# Patient Record
Sex: Female | Born: 2000 | Race: White | Hispanic: No | Marital: Single | Smoking: Never smoker
Health system: Southern US, Community
[De-identification: ages and names within clinical notes are randomized; demographics above are authoritative.]

## PROBLEM LIST (undated history)

## (undated) DIAGNOSIS — F411 Generalized anxiety disorder: Secondary | ICD-10-CM

## (undated) DIAGNOSIS — J302 Other seasonal allergic rhinitis: Secondary | ICD-10-CM

## (undated) DIAGNOSIS — R51 Headache: Secondary | ICD-10-CM

## (undated) DIAGNOSIS — F32A Depression, unspecified: Secondary | ICD-10-CM

## (undated) DIAGNOSIS — T7840XA Allergy, unspecified, initial encounter: Secondary | ICD-10-CM

## (undated) DIAGNOSIS — R519 Headache, unspecified: Secondary | ICD-10-CM

## (undated) DIAGNOSIS — R630 Anorexia: Secondary | ICD-10-CM

## (undated) DIAGNOSIS — F332 Major depressive disorder, recurrent severe without psychotic features: Secondary | ICD-10-CM

## (undated) DIAGNOSIS — F909 Attention-deficit hyperactivity disorder, unspecified type: Secondary | ICD-10-CM

## (undated) DIAGNOSIS — F509 Eating disorder, unspecified: Secondary | ICD-10-CM

## (undated) DIAGNOSIS — E282 Polycystic ovarian syndrome: Secondary | ICD-10-CM

## (undated) DIAGNOSIS — R633 Feeding difficulties: Secondary | ICD-10-CM

## (undated) DIAGNOSIS — F329 Major depressive disorder, single episode, unspecified: Secondary | ICD-10-CM

## (undated) DIAGNOSIS — F419 Anxiety disorder, unspecified: Secondary | ICD-10-CM

## (undated) DIAGNOSIS — R45851 Suicidal ideations: Secondary | ICD-10-CM

## (undated) DIAGNOSIS — R440 Auditory hallucinations: Secondary | ICD-10-CM

## (undated) DIAGNOSIS — Z62820 Parent-biological child conflict: Secondary | ICD-10-CM

## (undated) HISTORY — DX: Suicidal ideations: R45.851

## (undated) HISTORY — DX: Parent-biological child conflict: Z62.820

## (undated) HISTORY — DX: Feeding difficulties: R63.3

## (undated) HISTORY — DX: Anorexia: R63.0

## (undated) HISTORY — DX: Auditory hallucinations: R44.0

## (undated) HISTORY — PX: OTHER SURGICAL HISTORY: SHX169

## (undated) HISTORY — DX: Generalized anxiety disorder: F41.1

## (undated) HISTORY — DX: Depression, unspecified: F32.A

## (undated) HISTORY — DX: Major depressive disorder, recurrent severe without psychotic features: F33.2

## (undated) HISTORY — PX: TYMPANOSTOMY TUBE PLACEMENT: SHX32

## (undated) HISTORY — DX: Major depressive disorder, single episode, unspecified: F32.9

---
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
## 2021-02-17 ENCOUNTER — Ambulatory Visit: Payer: BLUE CROSS/BLUE SHIELD | Admitting: Gastroenterology

## 2021-02-17 ENCOUNTER — Other Ambulatory Visit: Payer: Self-pay

## 2021-02-17 ENCOUNTER — Encounter: Payer: Self-pay | Admitting: Gastroenterology

## 2021-02-17 VITALS — BP 130/85 | HR 105 | Temp 98.4°F | Ht 65.0 in | Wt 196.0 lb

## 2021-02-17 DIAGNOSIS — K921 Melena: Secondary | ICD-10-CM

## 2021-02-17 DIAGNOSIS — R197 Diarrhea, unspecified: Secondary | ICD-10-CM

## 2021-02-17 DIAGNOSIS — R7982 Elevated C-reactive protein (CRP): Secondary | ICD-10-CM | POA: Diagnosis not present

## 2021-02-17 MED ORDER — NA SULFATE-K SULFATE-MG SULF 17.5-3.13-1.6 GM/177ML PO SOLN: ORAL | 0 refills | Status: DC

## 2021-02-17 NOTE — Progress Notes (Signed)
[name]  [place], [place] [address]  Main: [phone]  Fax: [phone]   Gastroenterology Consultation  Referring Provider:     Luna Fuse, MD Primary Care Physician:  Luna Fuse, MD Reason for Consultation:     Bloody diarrhea        HPI:    Chief Complaint  Patient presents with  . Bloody diarrhea    X 3 weeks  . nausea and vomiting    Dominique [name] is a 20 y.o. y/o female referred for consultation & management  by Dr. Randel Books, Tivis Ringer, MD.  Patient presents for evaluation with her father.  She states she has had 3 weeks of diarrhea with blood in stool with no prior history of similar symptoms.  No sick contacts.  No one in the family with similar symptoms.  No fever or chills.  Symptoms have not improved since being started.  Does report dull abdominal pain associated with this, intermittent, 5/10, nonradiating.  Patient had lab tests done due to this, on 02/10/2021, which showed stool test negative for Salmonella, Shigella, Campylobacter, E. coli Shiga toxin, ova, cysts or parasites, Giardia, C. difficile toxins a and B.  Fecal calprotectin was normal at 21  TIBC normal at 302, iron normal at 40, iron saturation mildly low at 13, ferritin.  Hemoglobin normal at 12.8, with normal MCV.  Normal white count. Normal liver enzymes.  TTG IgA negative.  DM.  Creatinine IgA negative.  Total IgA normal.  ESR normal at 24, with reference range being from 0-32.  CRP elevated at 13  After further questioning, patient states that for the last year she has experienced stomach cramping on a frequent basis but attributes this to her anxiety issues.  Past Medical History:  Diagnosis Date  . ADHD (attention deficit hyperactivity disorder)   . Allergy   . Anxiety   . Auditory hallucination 09/16/2016  . Decreased appetite 10/14/2015  . Depression   . Eating disorder   . GAD (generalized anxiety disorder) 12/16/2015  .  Headache   . MDD (major depressive disorder), recurrent episode, severe ([place]) 09/13/2016  . Parent-child conflict 04/23/931  . Picky eater 10/14/2015  . Seasonal allergies 10/12/2015  . Severe episode of recurrent major depressive disorder, without psychotic features ([place]) 10/09/2015  . Suicidal ideation 09/14/2016    Past Surgical History:  Procedure Laterality Date  . OTHER SURGICAL HISTORY Bilateral ukn   hx tubes in both ears/fell out  . TYMPANOSTOMY TUBE PLACEMENT Bilateral     Prior to Admission medications   Medication Sig Start Date End Date Taking? Authorizing Provider  Na Sulfate-K Sulfate-Mg Sulf 17.5-3.13-1.6 GM/177ML SOLN At 5 PM the day before procedure take 1 bottle and 5 hours before procedure take 1 bottle. 02/17/21  Yes Virgel Manifold, MD    Family History  Problem Relation Age of Onset  . Bipolar disorder Mother        dx at 77  . ADD / ADHD Father        undx  . ADD / ADHD Brother   . Depression Maternal Grandmother   . Anxiety disorder Maternal Grandmother   . Bipolar disorder Maternal Grandmother      Social History   Tobacco Use  . Smoking status: Never Smoker  . Smokeless tobacco: Never Used  Vaping Use  . Vaping Use: Never used  Substance Use Topics  . Alcohol use: No  . Drug use: No  Allergies as of 02/17/2021 - Review Complete 02/17/2021  Allergen Reaction Noted  . Elemental sulfur Hives 09/17/2018  . Omnicef [cefdinir] Hives 10/08/2015    Review of Systems:    All systems reviewed and negative except where noted in HPI.   Physical Exam:  BP 130/85   Pulse (!) 105   Temp 98.4 F (36.9 C) (Oral)   Ht $R'5\' 5"'UA$  (1.651 m)   Wt 196 lb (88.9 kg)   BMI 32.62 kg/m  No LMP recorded. Psych:  Alert and cooperative. Normal mood and affect. General:   Alert,  Well-developed, well-nourished, pleasant and cooperative in NAD Head:  Normocephalic and atraumatic. Eyes:  Sclera clear, no icterus.   Conjunctiva pink. Ears:  Normal  auditory acuity. Nose:  No deformity, discharge, or lesions. Mouth:  No deformity or lesions,oropharynx pink & moist. Neck:  Supple; no masses or thyromegaly. Abdomen:  Normal bowel sounds.  No bruits.  Soft, non-tender and non-distended without masses, hepatosplenomegaly or hernias noted.  No guarding or rebound tenderness.    Msk:  Symmetrical without gross deformities. Good, equal movement & strength bilaterally. Pulses:  Normal pulses noted. Extremities:  No clubbing or edema.  No cyanosis. Neurologic:  Alert and oriented x3;  grossly normal neurologically. Skin:  Intact without significant lesions or rashes. No jaundice. Lymph Nodes:  No significant cervical adenopathy. Psych:  Alert and cooperative. Normal mood and affect.   Labs: CBC    Component Value Date/Time   WBC 13.1 09/17/2018 2337   RBC 5.10 09/17/2018 2337   HGB 13.9 09/17/2018 2337   HCT 42.8 09/17/2018 2337   PLT 353 09/17/2018 2337   MCV 83.9 09/17/2018 2337   MCH 27.3 09/17/2018 2337   MCHC 32.5 09/17/2018 2337   RDW 12.7 09/17/2018 2337   LYMPHSABS 4.4 09/17/2018 2337   MONOABS 0.8 09/17/2018 2337   EOSABS 0.0 09/17/2018 2337   BASOSABS 0.1 09/17/2018 2337   CMP     Component Value Date/Time   NA 141 09/17/2018 2337   K 3.7 09/17/2018 2337   CL 108 09/17/2018 2337   CO2 26 09/17/2018 2337   GLUCOSE 93 09/17/2018 2337   BUN 12 09/17/2018 2337   CREATININE 0.68 09/17/2018 2337   CALCIUM 9.2 09/17/2018 2337   PROT 7.1 09/17/2018 2337   ALBUMIN 4.2 09/17/2018 2337   AST 17 09/17/2018 2337   ALT 20 09/17/2018 2337   ALKPHOS 77 09/17/2018 2337   BILITOT 0.3 09/17/2018 2337   GFRNONAA NOT CALCULATED 09/17/2018 2337   GFRAA NOT CALCULATED 09/17/2018 2337    Imaging Studies: No results found.  Assessment and Plan:   Dominique [name] is a 20 y.o. y/o female has been referred for bloody diarrhea for 3 weeks with no improvement and stool test negative for infectious etiology  Her bloody diarrhea  did start acutely with no prior history of similar symptoms, but she does state that she has chronically had abdominal cramping with some loose stools but never blood in stool before.  Since infectious panel is negative, IBD is on the differential, especially since symptoms are not improving  We discussed the option of proceeding with colonoscopy to rule out IBD, versus conservative management for another 1 to 2 weeks to see if symptoms not resolved, with a repeat stool test this week, to also include a more comprehensive stool test  However, given that majority of the infectious etiology has been ruled out with the stool test that was done, and symptoms are not improving,  patient and family would like to proceed with colonoscopy after discussing risks and benefits in detail  I have advised him to call me if symptoms improve between today and the scheduled procedure date next week  CRP is also mildly elevated, ESR high normal, and iron saturation low, therefore important to rule out IBD at this time as well  No indication for urgent procedure at this time  Dr [name]  Speech recognition software was used to dictate the above note.

## 2021-02-20 ENCOUNTER — Other Ambulatory Visit: Payer: Self-pay

## 2021-02-20 ENCOUNTER — Other Ambulatory Visit
Admission: RE | Admit: 2021-02-20 | Discharge: 2021-02-20 | Disposition: A | Discharge status: Home / Self Care | Payer: BC Managed Care – PPO | Source: Ambulatory Visit | Attending: Gastroenterology | Admitting: Gastroenterology

## 2021-02-20 DIAGNOSIS — Z01812 Encounter for preprocedural laboratory examination: Secondary | ICD-10-CM | POA: Insufficient documentation

## 2021-02-20 DIAGNOSIS — Z20822 Contact with and (suspected) exposure to covid-19: Secondary | ICD-10-CM | POA: Insufficient documentation

## 2021-02-20 LAB — SARS CORONAVIRUS 2 (TAT 6-24 HRS): SARS Coronavirus 2: NEGATIVE

## 2021-02-24 ENCOUNTER — Ambulatory Visit
Admission: RE | Admit: 2021-02-24 | Discharge: 2021-02-24 | Disposition: A | Discharge status: Home / Self Care | Payer: BC Managed Care – PPO | Attending: Gastroenterology | Admitting: Gastroenterology

## 2021-02-24 ENCOUNTER — Ambulatory Visit: Payer: BC Managed Care – PPO | Admitting: Anesthesiology

## 2021-02-24 ENCOUNTER — Encounter: Payer: Self-pay | Admitting: Gastroenterology

## 2021-02-24 ENCOUNTER — Encounter
Admission: RE | Disposition: A | Discharge status: Home / Self Care | Payer: Self-pay | Source: Home / Self Care | Attending: Gastroenterology

## 2021-02-24 DIAGNOSIS — K5100 Ulcerative (chronic) pancolitis without complications: Secondary | ICD-10-CM

## 2021-02-24 DIAGNOSIS — K921 Melena: Secondary | ICD-10-CM

## 2021-02-24 DIAGNOSIS — K51011 Ulcerative (chronic) pancolitis with rectal bleeding: Secondary | ICD-10-CM | POA: Insufficient documentation

## 2021-02-24 DIAGNOSIS — Z888 Allergy status to other drugs, medicaments and biological substances status: Secondary | ICD-10-CM | POA: Insufficient documentation

## 2021-02-24 DIAGNOSIS — K625 Hemorrhage of anus and rectum: Secondary | ICD-10-CM | POA: Diagnosis present

## 2021-02-24 DIAGNOSIS — Z818 Family history of other mental and behavioral disorders: Secondary | ICD-10-CM | POA: Insufficient documentation

## 2021-02-24 HISTORY — PX: COLONOSCOPY WITH PROPOFOL: SHX5780

## 2021-02-24 LAB — POCT PREGNANCY, URINE: Preg Test, Ur: NEGATIVE

## 2021-02-24 SURGERY — COLONOSCOPY WITH PROPOFOL
Anesthesia: General

## 2021-02-24 MED ORDER — ONDANSETRON HCL 4 MG/2ML IJ SOLN
INTRAMUSCULAR | Status: AC
  Filled 2021-02-24: qty 2

## 2021-02-24 MED ORDER — LIDOCAINE HCL (CARDIAC) PF 100 MG/5ML IV SOSY
PREFILLED_SYRINGE | INTRAVENOUS | Status: DC | PRN
  Administered 2021-02-24: 100 mg via INTRAVENOUS

## 2021-02-24 MED ORDER — PROPOFOL 500 MG/50ML IV EMUL
INTRAVENOUS | Status: DC | PRN
  Administered 2021-02-24: 180 ug/kg/min via INTRAVENOUS

## 2021-02-24 MED ORDER — PROPOFOL 10 MG/ML IV BOLUS
INTRAVENOUS | Status: DC | PRN
  Administered 2021-02-24 (×2): 100 mg via INTRAVENOUS

## 2021-02-24 MED ORDER — SODIUM CHLORIDE 0.9 % IV SOLN: INTRAVENOUS | Status: DC

## 2021-02-24 MED ORDER — PROPOFOL 500 MG/50ML IV EMUL
INTRAVENOUS | Status: AC
  Filled 2021-02-24: qty 50

## 2021-02-24 MED ORDER — ONDANSETRON HCL 4 MG/2ML IJ SOLN
INTRAMUSCULAR | Status: DC | PRN
  Administered 2021-02-24: 4 mg via INTRAVENOUS
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
## 2021-02-24 NOTE — Op Note (Signed)
[facility] Gastroenterology Patient Name: Dominique [name] Procedure Date: 02/24/2021 10:53 AM MRN: [id number] Account #: [id number] Date of Birth: [birth year] Admit Type: Outpatient Age: 20 Room: [facility] ENDO ROOM 2 Gender: Female Note Status: Finalized Procedure:             Colonoscopy Indications:           Rectal bleeding Providers:             Goebel Hellums B. Bonna Gains MD, MD Referring MD:          Tivis Ringer. Randel Books, MD (Referring MD) Medicines:             Monitored Anesthesia Care Complications:         No immediate complications. Procedure:             Pre-Anesthesia Assessment:                        - Prior to the procedure, a History and Physical was                         performed, and patient medications, allergies and                         sensitivities were reviewed. The patient's tolerance                         of previous anesthesia was reviewed.                        - The risks and benefits of the procedure and the                         sedation options and risks were discussed with the                         patient. All questions were answered and informed                         consent was obtained.                        - Patient identification and proposed procedure were                         verified prior to the procedure by the physician, the                         nurse, the anesthesiologist, the anesthetist and the                         technician. The procedure was verified in the                         pre-procedure area in the procedure room in the                         endoscopy suite.                        - Prophylactic Antibiotics: The patient does  not                         require prophylactic antibiotics.                        - ASA Grade Assessment: II - A patient with mild                         systemic disease.                        - After reviewing the risks and benefits, the patient                          was deemed in satisfactory condition to undergo the                         procedure.                        - Monitored anesthesia care was determined to be                         medically necessary for this procedure based on review                         of the patient's medical history, medications, and                         prior anesthesia history.                        - The anesthesia plan was to use monitored anesthesia                         care (MAC).                        After obtaining informed consent, the colonoscope was                         passed under direct vision. Throughout the procedure,                         the patient's blood pressure, pulse, and oxygen                         saturations were monitored continuously. The                         Colonoscope was introduced through the anus and                         advanced to the the cecum, identified by appendiceal                         orifice and ileocecal valve. The colonoscopy was                         performed with ease. The patient  tolerated the                         procedure well. The quality of the bowel preparation                         was adequate. Findings:      The perianal and digital rectal examinations were normal.      A patchy area of mucosa in the terminal ileum was mildly erythematous.       Biopsies were taken with a cold forceps for histology. The terminal       ileum had 1-2 small areas of erythema, but was otherwise normal.       Biopsies were taken of the erythematous and normal areas in separate       jars.      Diffuse severe inflammation characterized by altered vascularity,       congestion (edema), erythema, friability and shallow ulcerations was       found in the entire colon. Biopsies were taken with a cold forceps for       histology. Impression:            - Erythematous mucosa in the terminal ileum. Biopsied.                        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 Biopsied. Recommendation:        - Await pathology results.                        - Endoscopic findings suggest Ulcerative Colitis.                         Pathology was sent STAT, and pt will need to be                         initiated on medications once results confirm UC.                        - Continue present medications.                        - Return to my office in 2 weeks.                        - Return to primary care physician as previously                         scheduled.                        - The findings and recommendations were discussed with                         the patient.                        -  The findings and recommendations were discussed with                         the patient's family. 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 All rights reserved. The codes documented in this report are preliminary and upon coder review may  be revised to meet current compliance requirements.  [name], MD Margretta Sidle B. Bonna Gains MD, MD 02/24/2021 11:21:49 AM This report has been signed electronically. Number of Addenda: 0 Note Initiated On: 02/24/2021 10:53 AM Scope Withdrawal Time: 0 hours 9 minutes 7 seconds  Total Procedure Duration: 0 hours 12 minutes 39 seconds  Estimated Blood Loss:  Estimated blood loss: none.      [facility]

## 2021-02-24 NOTE — Anesthesia Preprocedure Evaluation (Signed)
Anesthesia Evaluation  Patient identified by MRN, date of birth, ID band Patient awake    Reviewed: Allergy & Precautions, NPO status , Patient's Chart, lab work & pertinent test results  History of Anesthesia Complications Negative for: history of anesthetic complications  Airway Mallampati: II  TM Distance: >3 FB Neck ROM: Full    Dental no notable dental hx.    Pulmonary neg pulmonary ROS, neg sleep apnea, neg COPD,    breath sounds clear to auscultation- rhonchi (-) wheezing      Cardiovascular Exercise Tolerance: Good (-) hypertension(-) CAD and (-) Past MI  Rhythm:Regular Rate:Normal - Systolic murmurs and - Diastolic murmurs    Neuro/Psych  Headaches, PSYCHIATRIC DISORDERS Anxiety Depression    GI/Hepatic negative GI ROS, Neg liver ROS,   Endo/Other  negative endocrine ROSneg diabetes  Renal/GU negative Renal ROS     Musculoskeletal negative musculoskeletal ROS (+)   Abdominal (+) + obese,   Peds  Hematology negative hematology ROS (+)   Anesthesia Other Findings Past Medical History: No date: ADHD (attention deficit hyperactivity disorder) No date: Allergy No date: Anxiety 09/16/2016: Auditory hallucination 10/14/2015: Decreased appetite No date: Depression No date: Eating disorder 12/16/2015: GAD (generalized anxiety disorder) No date: Headache 09/13/2016: MDD (major depressive disorder), recurrent episode,  severe ([place]) 01/07/2016: Parent-child conflict 02/63/7858: Picky eater 10/12/2015: Seasonal allergies 10/09/2015: Severe episode of recurrent major depressive disorder,  without psychotic features ([place]) 09/14/2016: Suicidal ideation   Reproductive/Obstetrics                             Anesthesia Physical Anesthesia Plan  ASA: II  Anesthesia Plan: General   Post-op Pain Management:    Induction: Intravenous  PONV Risk Score and Plan: 2 and Propofol  infusion  Airway Management Planned: Natural Airway  Additional Equipment:   Intra-op Plan:   Post-operative Plan:   Informed Consent: I have reviewed the patients History and Physical, chart, labs and discussed the procedure including the risks, benefits and alternatives for the proposed anesthesia with the patient or authorized representative who has indicated his/her understanding and acceptance.     Dental advisory given  Plan Discussed with: CRNA and Anesthesiologist  Anesthesia Plan Comments:         Anesthesia Quick Evaluation

## 2021-02-24 NOTE — Transfer of Care (Signed)
Immediate Anesthesia Transfer of Care Note  Patient: Dominique [name]  Procedure(s) Performed: COLONOSCOPY WITH PROPOFOL (N/A )  Patient Location: PACU and Endoscopy Unit  Anesthesia Type:General  Level of Consciousness: drowsy and patient cooperative  Airway & Oxygen Therapy: Patient Spontanous Breathing  Post-op Assessment: Report given to RN and Post -op Vital signs reviewed and stable  Post vital signs: Reviewed and stable  Last Vitals:  Vitals Value Taken Time  BP 97/53 02/24/21 1118  Temp    Pulse 95 02/24/21 1119  Resp 30 02/24/21 1119  SpO2 97 % 02/24/21 1119  Vitals shown include unvalidated device data.  Last Pain:  Vitals:   02/24/21 0927  TempSrc: Temporal  PainSc: 0-No pain         Complications: No complications documented.

## 2021-02-24 NOTE — Anesthesia Postprocedure Evaluation (Signed)
Anesthesia Post Note  Patient: Dominique [name]  Procedure(s) Performed: COLONOSCOPY WITH PROPOFOL (N/A )  Patient location during evaluation: Endoscopy Anesthesia Type: General Level of consciousness: awake and alert and oriented Pain management: pain level controlled Vital Signs Assessment: post-procedure vital signs reviewed and stable Respiratory status: spontaneous breathing, nonlabored ventilation and respiratory function stable Cardiovascular status: blood pressure returned to baseline and stable Postop Assessment: no signs of nausea or vomiting Anesthetic complications: no   No complications documented.   Last Vitals:  Vitals:   02/24/21 1148 02/24/21 1200  BP: 111/69 117/71  Pulse: 81 77  Resp: (!) 24 17  Temp:    SpO2: 100% 100%    Last Pain:  Vitals:   02/24/21 1200  TempSrc:   PainSc: 0-No pain                 Chaz Mcglasson

## 2021-02-25 LAB — SURGICAL PATHOLOGY

## 2021-02-26 ENCOUNTER — Other Ambulatory Visit: Payer: Self-pay | Admitting: Gastroenterology

## 2021-02-26 ENCOUNTER — Telehealth: Payer: Self-pay | Admitting: Gastroenterology

## 2021-02-26 DIAGNOSIS — K5100 Ulcerative (chronic) pancolitis without complications: Secondary | ICD-10-CM

## 2021-02-26 MED ORDER — PREDNISONE 10 MG PO TABS: 40.0000 mg | ORAL_TABLET | Freq: Every day | ORAL | 0 refills | Status: DC

## 2021-02-26 NOTE — Telephone Encounter (Signed)
Dad is calling.... dyhydrated, dizzy, nausea and vomiting. What can she do? Dad is frustrated. Please call.

## 2021-03-03 NOTE — Telephone Encounter (Signed)
Patient was called again and she did not answer. However, patient has an appointment tomorrow with Dr. Bonna Gains.  Patient was also informed that Dr. Bonna Gains is recommending labs to be drawn STAT.

## 2021-03-04 ENCOUNTER — Other Ambulatory Visit: Payer: Self-pay

## 2021-03-04 ENCOUNTER — Ambulatory Visit: Payer: BC Managed Care – PPO | Admitting: Gastroenterology

## 2021-03-04 ENCOUNTER — Encounter: Payer: Self-pay | Admitting: Gastroenterology

## 2021-03-04 VITALS — BP 121/76 | HR 80 | Temp 98.2°F | Ht 65.0 in | Wt 197.6 lb

## 2021-03-04 DIAGNOSIS — K5100 Ulcerative (chronic) pancolitis without complications: Secondary | ICD-10-CM | POA: Diagnosis not present

## 2021-03-04 NOTE — Patient Instructions (Signed)
Please finish taking your Prednisone medication.  Please call [facility] and schedule a new patient appointment at [phone].  We will send your paperwork to [facility] and they will contact you to set up injections.

## 2021-03-05 NOTE — Progress Notes (Signed)
[name], MD [address]  [place]  [place], [place] [address]  Main: [phone]  Fax: [phone]   Primary Care Physician: Luna Fuse, MD   Chief Complaint  Patient presents with  . Blood In Stools    HPI: Dominique [name] is a 20 y.o. female here for follow-up of ulcerative pancolitis.  Patient has been taking prednisone 40 mg a day starting 02/27/2021.  Patient has noted improvement in bowel movements and rectal bleeding since then.  She states she is noting less blood in her stool, decreased abdominal cramping, and decreased amount of blood in stool.  No weight loss.  No no fever or chills.  No joint pain.  No nausea or vomiting.  Current Outpatient Medications  Medication Sig Dispense Refill  . predniSONE (DELTASONE) 10 MG tablet Take 4 tablets (40 mg total) by mouth daily with breakfast. 100 tablet 0   No current facility-administered medications for this visit.    Allergies as of 03/04/2021 - Review Complete 03/04/2021  Allergen Reaction Noted  . Elemental sulfur Hives 09/17/2018  . Omnicef [cefdinir] Hives 10/08/2015    ROS:  General: Negative for anorexia, weight loss, fever, chills, fatigue, weakness. ENT: Negative for hoarseness, difficulty swallowing , nasal congestion. CV: Negative for chest pain, angina, palpitations, dyspnea on exertion, peripheral edema.  Respiratory: Negative for dyspnea at rest, dyspnea on exertion, cough, sputum, wheezing.  GI: See history of present illness. GU:  Negative for dysuria, hematuria, urinary incontinence, urinary frequency, nocturnal urination.  Endo: Negative for unusual weight change.    Physical Examination:   BP 121/76   Pulse 80   Temp 98.2 F (36.8 C) (Oral)   Ht 5\' 5"  (1.651 m)   Wt 197 lb 9.6 oz (89.6 kg)   BMI 32.88 kg/m   General: Well-nourished, well-developed in no acute distress.  Eyes: No icterus. Conjunctivae pink. Mouth: Oropharyngeal mucosa moist and pink , no  lesions erythema or exudate. Neck: Supple, Trachea midline Abdomen: Bowel sounds are normal, nontender, nondistended, no hepatosplenomegaly or masses, no abdominal bruits or hernia , no rebound or guarding.   Extremities: No lower extremity edema. No clubbing or deformities. Neuro: Alert and oriented x 3.  Grossly intact. Skin: Warm and dry, no jaundice.   Psych: Alert and cooperative, normal mood and affect.   Labs: CMP     Component Value Date/Time   NA 141 09/17/2018 2337   K 3.7 09/17/2018 2337   CL 108 09/17/2018 2337   CO2 26 09/17/2018 2337   GLUCOSE 93 09/17/2018 2337   BUN 12 09/17/2018 2337   CREATININE 0.68 09/17/2018 2337   CALCIUM 9.2 09/17/2018 2337   PROT 7.1 09/17/2018 2337   ALBUMIN 4.2 09/17/2018 2337   AST 17 09/17/2018 2337   ALT 20 09/17/2018 2337   ALKPHOS 77 09/17/2018 2337   BILITOT 0.3 09/17/2018 2337   GFRNONAA NOT CALCULATED 09/17/2018 2337   GFRAA NOT CALCULATED 09/17/2018 2337   Lab Results  Component Value Date   WBC 13.1 09/17/2018   HGB 13.9 09/17/2018   HCT 42.8 09/17/2018   MCV 83.9 09/17/2018   PLT 353 09/17/2018    Imaging Studies: No results found.  Assessment and Plan:   Dominique [name] is a 20 y.o. y/o female here for follow-up of ulcerative pancolitis  Patient has moderate to severe disease and therefore will need biologic therapy  After our visit last time, patient has researched treatment options and prefers to proceed with an  infusion option as she states she will likely forget to take her subq doses at home herself.  Her father is with her today and agrees with her as well  Adverse effects of Biologics and specifically Entyvio discussed given that patient's preference is not to be after discussing all options in detail  We will start approval process  Continue prednisone 40 mg a day, patient is currently only on day 6 of therapy  Follow-up in 10 to 14 days and if complete symptom improvement, start tapering  medication  Labs were ordered on last visit but patient never got them done.  Will obtain today.  I have strongly encouraged her to establish PCP as soon as possible and she will need to stay up-to-date on her immunizations, which was discussed with her in detail as well.  Pneumonia vaccine is recommended since patient will be on biologic therapy. Annual skin exam will be needed Patient advised to stay up-to-date on OB/GYN exams as well and establish care with an OB/GYN as well.  Patient and family agreeable with above plan and state they will start working on the above recommendations with their PCP as well    Dr [name]

## 2021-03-08 LAB — STATUS REPORT

## 2021-03-10 ENCOUNTER — Telehealth: Payer: Self-pay

## 2021-03-10 NOTE — Telephone Encounter (Signed)
-----  Message from Virgel Manifold, MD sent at 03/05/2021  3:15 PM EDT ----- Herb Grays please let the patient know, her labs show that she does not have immunity to hepatitis B.    Patient will need hepatitis B vaccination  Please contact the PCP office and tell them that patient has ulcerative colitis and will need to be on immunosuppressant, and request if they can update her vaccinations and that we are recommending that she obtain vaccination for:  1. Hep B 2. PCV 13 and then PPSV 23, 8 week later 3. Influenza  And if they can send [place] records of her MMR and Varicella vaccination as well that would be great.

## 2021-03-10 NOTE — Telephone Encounter (Signed)
[facility] reqested for [place] to fax physicians recommendations. Therefore, it was faxed to [phone].

## 2021-03-11 LAB — QUANTIFERON-TB GOLD PLUS
QuantiFERON Mitogen Value: 1.27 IU/mL
QuantiFERON Nil Value: 0.02 IU/mL
QuantiFERON TB1 Ag Value: 0.03 IU/mL
QuantiFERON TB2 Ag Value: 0.03 IU/mL
QuantiFERON-TB Gold Plus: NEGATIVE

## 2021-03-11 LAB — THIOPURINE METHYLTRANSFERASE (TPMT), RBC

## 2021-03-11 LAB — HEPATITIS B CORE ANTIBODY, TOTAL: Hep B Core Total Ab: NEGATIVE

## 2021-03-11 LAB — HEPATITIS B SURFACE ANTIGEN: Hepatitis B Surface Ag: NEGATIVE

## 2021-03-11 LAB — HEPATITIS B SURFACE ANTIBODY,QUALITATIVE: Hep B Surface Ab, Qual: NONREACTIVE

## 2021-03-16 ENCOUNTER — Encounter: Payer: Self-pay | Admitting: Gastroenterology

## 2021-03-16 ENCOUNTER — Other Ambulatory Visit: Payer: Self-pay

## 2021-03-16 ENCOUNTER — Ambulatory Visit: Payer: BC Managed Care – PPO | Admitting: Gastroenterology

## 2021-03-16 VITALS — BP 118/79 | HR 105 | Temp 98.7°F | Ht 65.0 in | Wt 204.0 lb

## 2021-03-16 DIAGNOSIS — K5100 Ulcerative (chronic) pancolitis without complications: Secondary | ICD-10-CM

## 2021-03-16 MED ORDER — PREDNISONE 10 MG PO TABS: ORAL_TABLET | ORAL | 0 refills | Status: AC

## 2021-03-16 NOTE — Patient Instructions (Addendum)
Please decrease your prednisone to 30mg  a day starting 03/21/2021 Decrease it further to 20 mg a day starting 03/27/2021 Decrease it to 10 mg a day starting 04/01/2021 and then stop

## 2021-03-17 NOTE — Progress Notes (Signed)
[name], MD [address]  [place]  [place], [place] [address]  Main: [phone]  Fax: [phone]   Primary Care Physician: Luna Fuse, MD   Chief Complaint  Patient presents with  . ulcerative pancolitis    HPI: Dominique [name] is a 20 y.o. adult here for follow-up of ulcerative pancolitis.  Patient has been on prednisone since 02/27/2021.  Reports significant improvement in symptoms, with decreased abdominal cramping, no further blood in stool since last visit.  Reporting 1-2 formed bowel movements a day.  Patient had elected to start [facility] and this was ordered.  Patient states she has been contacted and is being set up for an infusion either this week or next week.  No fever or chills.  No nausea or vomiting.  Reports improvement in appetite as well  Current Outpatient Medications  Medication Sig Dispense Refill  . predniSONE (DELTASONE) 10 MG tablet Take 4 tablets (40 mg total) by mouth daily with breakfast for 5 days, THEN 3 tablets (30 mg total) daily with breakfast for 7 days, THEN 2 tablets (20 mg total) daily with breakfast for 5 days, THEN 1 tablet (10 mg total) daily with breakfast for 5 days. 100 tablet 0   No current facility-administered medications for this visit.    Allergies as of 03/16/2021 - Review Complete 03/16/2021  Allergen Reaction Noted  . Elemental sulfur Hives 09/17/2018  . Omnicef [cefdinir] Hives 10/08/2015    ROS:  General: Negative for anorexia, weight loss, fever, chills, fatigue, weakness. ENT: Negative for hoarseness, difficulty swallowing , nasal congestion. CV: Negative for chest pain, angina, palpitations, dyspnea on exertion, peripheral edema.  Respiratory: Negative for dyspnea at rest, dyspnea on exertion, cough, sputum, wheezing.  GI: See history of present illness. GU:  Negative for dysuria, hematuria, urinary incontinence, urinary frequency, nocturnal urination.  Endo: Negative for unusual weight  change.    Physical Examination:   BP 118/79   Pulse (!) 105   Temp 98.7 F (37.1 C) (Oral)   Ht 5\' 5"  (1.651 m)   Wt 204 lb (92.5 kg)   BMI 33.95 kg/m   General: Well-nourished, well-developed in no acute distress.  Eyes: No icterus. Conjunctivae pink. Mouth: Oropharyngeal mucosa moist and pink , no lesions erythema or exudate. Neck: Supple, Trachea midline Abdomen: Bowel sounds are normal, nontender, nondistended, no hepatosplenomegaly or masses, no abdominal bruits or hernia , no rebound or guarding.   Extremities: No lower extremity edema. No clubbing or deformities. Neuro: Alert and oriented x 3.  Grossly intact. Skin: Warm and dry, no jaundice.   Psych: Alert and cooperative, normal mood and affect.   Labs: CMP     Component Value Date/Time   NA 141 09/17/2018 2337   K 3.7 09/17/2018 2337   CL 108 09/17/2018 2337   CO2 26 09/17/2018 2337   GLUCOSE 93 09/17/2018 2337   BUN 12 09/17/2018 2337   CREATININE 0.68 09/17/2018 2337   CALCIUM 9.2 09/17/2018 2337   PROT 7.1 09/17/2018 2337   ALBUMIN 4.2 09/17/2018 2337   AST 17 09/17/2018 2337   ALT 20 09/17/2018 2337   ALKPHOS 77 09/17/2018 2337   BILITOT 0.3 09/17/2018 2337   GFRNONAA NOT CALCULATED 09/17/2018 2337   GFRAA NOT CALCULATED 09/17/2018 2337   Lab Results  Component Value Date   WBC 13.1 09/17/2018   HGB 13.9 09/17/2018   HCT 42.8 09/17/2018   MCV 83.9 09/17/2018   PLT 353 09/17/2018    Imaging  Studies: No results found.  Assessment and Plan:   Dominique [name] is a 20 y.o. y/o adult here for follow-up of ulcerative pancolitis  Patient is clinically improving on prednisone We will start taperStarting 03/20/2021.  This will allow her to have 3 weeks of prednisone 40 mg daily, and then start tapering down to 30 mg daily for 7 days, 20 mg daily for 5 days, 10 mg daily for 5 days and then stop.  These instructions were given to her and sent to her pharmacy.  Patient and her father verbalized  understanding  Patient states infusion will be set up for Entyvio this week or next week.  I have advised her to let us know the exact date that the infusion get scheduled for her and she verbalized understanding  Follow-up in 4 weeks or earlier if any symptoms occur on prednisone taper  I have again advised her on the importance of getting her immunizations.  Recommendations for immunizations were faxed to her PCP as well.  I have advised her to call them this week to get her vaccination set up including hepatitis B, influenza, pneumonia, and others based on her age as detailed in our previous recommendations attached to her previous results    Dr [name]

## 2021-03-19 ENCOUNTER — Telehealth: Payer: Self-pay | Admitting: Gastroenterology

## 2021-03-19 NOTE — Telephone Encounter (Signed)
Keri from [place] called back to let you know that this patient's Dominique [name] is still pending in prior auth.  Should be back by tomorrow.  Kristin Bruins is to reach back out to patient and follow up with patient.  If questions call [phone] if you want info of insurance changes.

## 2021-04-02 ENCOUNTER — Telehealth: Payer: Self-pay | Admitting: Gastroenterology

## 2021-04-02 NOTE — Telephone Encounter (Signed)
Alisha at [facility] called [phone]   Reference code [id number]  Needs prior auth form for this patient's [employer].  Please call back

## 2021-04-14 ENCOUNTER — Telehealth: Payer: Self-pay | Admitting: Gastroenterology

## 2021-04-14 NOTE — Telephone Encounter (Signed)
Dominique [name] from [facility] returning phone call.  Prior Auth for [employer] prior auth was approved.    If  Questions, call [phone]

## 2021-04-14 NOTE — Telephone Encounter (Signed)
Called back and the [facility] and they were in a meeting. I left a voicemail to return my call.

## 2021-04-15 ENCOUNTER — Encounter: Payer: Self-pay | Admitting: Gastroenterology

## 2021-04-15 NOTE — Progress Notes (Signed)
There has been an email thread about getting this patient's medication approved.  I was able to get the medication approved on May 6th 2022 and I documented this in the [facility] email thread. Patient was supposed to get started on her infusion after this.   However, I just received an email today from Emmit Pomfret at [employer] and as per the email: "Just wanted to bring you up to speed on Dominique [name].  She is on her fathers insurance which termed on 5/23 so they decided to hold off on starting medicine until new auth is done with his new insurance policy since the first 2 doses are within 2 weeks of each other.  Once he calls [place] with his new insurance, we will have to start a new authorization since it's a new insurance for coverage.   I will keep you in the loop though!"   The patient or family did not notify or discuss with [place] before they opted to not go through with the infusion that was already approved by the initial insurance. Our clinic will call them to follow up.

## 2021-04-16 NOTE — Telephone Encounter (Signed)
Called [employer] and they stated that the patient's medication was approved by the medical side but not the pharmacy side. However, when I asked how I was able to get approved, they stated that the patient was changing insurance and therefore everything that had been done to be approved will not be valid and we will have to start all over. This information was provided to Dr. Bonna Gains. Patient will call us once her dad has a new insurance in order for [place] to start the process on getting Entyvio approved again.

## 2021-04-16 NOTE — Telephone Encounter (Signed)
Please see telephone note from 04/02/2021 in reference to the follow up of this call.

## 2021-05-07 ENCOUNTER — Other Ambulatory Visit: Payer: Self-pay

## 2021-05-07 ENCOUNTER — Ambulatory Visit: Payer: BC Managed Care – PPO | Admitting: Gastroenterology

## 2021-05-07 ENCOUNTER — Telehealth: Payer: Self-pay | Admitting: Gastroenterology

## 2021-05-07 VITALS — BP 126/81 | HR 79 | Temp 98.1°F | Ht 65.0 in | Wt 211.5 lb

## 2021-05-07 DIAGNOSIS — K5100 Ulcerative (chronic) pancolitis without complications: Secondary | ICD-10-CM

## 2021-05-07 NOTE — Telephone Encounter (Signed)
Patient was seen today in the office. She states she has got one call from [facility] but that it. Reach out to [place] with [facility] and they said they need prescription signed since patient has change insurance. Printed paper she emailed to me and faxed it back to them.  Will Email Sharrie Rothman on Monday to find out the status of the medication

## 2021-05-07 NOTE — Progress Notes (Signed)
[name], MD [address]  [place]  [place], [place] [address]  Main: [phone]  Fax: [phone]   Primary Care Physician: Luna Fuse, MD   Chief Complaint  Patient presents with   pancolitis     Patient has not started Entyvio she states she is having lower abdominal pain and some blood in stool     HPI: Dominique [name] is a 20 y.o. adult here for follow-up of ulcerative pancolitis.  Patient was started on prednisone on 02/27/2021 at 40 mg a day and started tapering it slowly on 03/20/2021 and has completed prednisone taper  As per previous documentation Entyvio approval process was done and was approved on Mar 27, 2021, but due to changes in insurance, patient did not schedule the infusion even though it was approved and we were not notified about it until the insurance had already changed.  Therefore, there is a delay in her infusion at this time.  As of the last week, patient states her stomach cramping is starting to come back that had previously improved on prednisone.  As of the last week, small amount of blood streaks in stool has also been noted.  No fever or chills.  No nausea or vomiting.   ROS: All ROS reviewed a negative except as per HPI   Past Medical History:  Diagnosis Date   ADHD (attention deficit hyperactivity disorder)    Allergy    Anxiety    Auditory hallucination 09/16/2016   Decreased appetite 10/14/2015   Depression    Eating disorder    GAD (generalized anxiety disorder) 12/16/2015   Headache    MDD (major depressive disorder), recurrent episode, severe ([place]) 09/13/2016   Parent-child conflict 02/18/761   Picky eater 10/14/2015   Seasonal allergies 10/12/2015   Severe episode of recurrent major depressive disorder, without psychotic features ([place]) 10/09/2015   Suicidal ideation 09/14/2016    Past Surgical History:  Procedure Laterality Date   COLONOSCOPY WITH PROPOFOL N/A 02/24/2021   Procedure: COLONOSCOPY WITH  PROPOFOL;  Surgeon: Virgel Manifold, MD;  Location: [facility] ENDOSCOPY;  Service: Endoscopy;  Laterality: N/A;   OTHER SURGICAL HISTORY Bilateral ukn   hx tubes in both ears/fell out   TYMPANOSTOMY TUBE PLACEMENT Bilateral     Prior to Admission medications   Not on File    Family History  Problem Relation Age of Onset   Bipolar disorder Mother        dx at 35   ADD / ADHD Father        undx   ADD / ADHD Brother    Depression Maternal Grandmother    Anxiety disorder Maternal Grandmother    Bipolar disorder Maternal Grandmother      Social History   Tobacco Use   Smoking status: Never   Smokeless tobacco: Never  Vaping Use   Vaping Use: Never used  Substance Use Topics   Alcohol use: No   Drug use: No    Allergies as of 05/07/2021 - Review Complete 05/07/2021  Allergen Reaction Noted   Elemental sulfur Hives 09/17/2018   Omnicef [cefdinir] Hives 10/08/2015    Physical Examination:   BP 126/81 (BP Location: Left Arm, Patient Position: Sitting, Cuff Size: Normal)   Pulse 79   Temp 98.1 F (36.7 C) (Oral)   Ht 5\' 5"  (1.651 m)   Wt 211 lb 8 oz (95.9 kg)   BMI 35.20 kg/m   Constitutional: General:   Alert,  Well-developed, well-nourished,  pleasant and cooperative in NAD BP 126/81 (BP Location: Left Arm, Patient Position: Sitting, Cuff Size: Normal)   Pulse 79   Temp 98.1 F (36.7 C) (Oral)   Ht 5\' 5"  (1.651 m)   Wt 211 lb 8 oz (95.9 kg)   BMI 35.20 kg/m   Eyes:  Sclera clear, no icterus.   Conjunctiva pink. PERRLA  Ears:  No scars, lesions or masses, Normal auditory acuity. Nose:  No deformity, discharge, or lesions. Mouth:  No deformity or lesions, oropharynx pink & moist.  Neck:  Supple; no masses or thyromegaly.  Respiratory: Normal respiratory effort, Normal percussion  Gastrointestinal:  Normal bowel sounds.  No bruits.  Soft, non-tender and non-distended without masses, hepatosplenomegaly or hernias noted.  No guarding or rebound tenderness.      Cardiac: No clubbing or edema.  No cyanosis. Normal posterior tibial pedal pulses noted.  Lymphatic:  No significant cervical or axillary adenopathy.  Psych:  Alert and cooperative. Normal mood and affect.  Musculoskeletal:  Normal gait. Head normocephalic, atraumatic. Symmetrical without gross deformities. 5/5 Upper and Lower extremity strength bilaterally.  Skin: Warm. Intact without significant lesions or rashes. No jaundice.  Neurologic:  Face symmetrical, tongue midline, Normal sensation to touch;  grossly normal neurologically.  Psych:  Alert and oriented x3, Alert and cooperative. Normal mood and affect.  Labs: CMP     Component Value Date/Time   NA 141 09/17/2018 2337   K 3.7 09/17/2018 2337   CL 108 09/17/2018 2337   CO2 26 09/17/2018 2337   GLUCOSE 93 09/17/2018 2337   BUN 12 09/17/2018 2337   CREATININE 0.68 09/17/2018 2337   CALCIUM 9.2 09/17/2018 2337   PROT 7.1 09/17/2018 2337   ALBUMIN 4.2 09/17/2018 2337   AST 17 09/17/2018 2337   ALT 20 09/17/2018 2337   ALKPHOS 77 09/17/2018 2337   BILITOT 0.3 09/17/2018 2337   GFRNONAA NOT CALCULATED 09/17/2018 2337   GFRAA NOT CALCULATED 09/17/2018 2337   Lab Results  Component Value Date   WBC 13.1 09/17/2018   HGB 13.9 09/17/2018   HCT 42.8 09/17/2018   MCV 83.9 09/17/2018   PLT 353 09/17/2018    Imaging Studies:    Assessment and Plan:   Dominique [name] is a 20 y.o. y/o adult here for follow-up of ulcerative pancolitis  Our clinic staff was able to reach the appropriate personnel in regard to her [facility] approval process and as per them, they just need an updated prescription for the medication again, and this has been faxed to them today.  They state did that as soon as they get it, they should be able to send her/schedule her medication dose.  I have advised the patient to call us back within 2 to 3 days if she does not have the medication by then.  However, if her symptoms get worse in the  meantime, she was advised to call us even after hours if needed.  Currently she does not have any fever or chills.  Her symptoms are starting to recur, although only mildly over the last week.  Therefore, I do not think that we need to repeat prednisone therapy at this point.  If her Weyman Rodney continues to be delayed due to lapses in insurance as described in HPI, we may need to consider starting Imuran in the meantime while we are waiting Entyvio approval and then start Entyvio once it is approved for combination therapy  Continue follow-up in clinic closely  We had an extensive  discussion with the patient about Imuran as well given that we may need to possibly started if Weyman Rodney is not available within the next week.  We discussed risks with Imuran including infection, lymphoma, malignancy.  Patient understands these risks and is okay with starting the medication if needed.   Dr [name]

## 2021-05-07 NOTE — Telephone Encounter (Signed)
Needs orders for [facility]

## 2021-05-14 ENCOUNTER — Telehealth: Payer: Self-pay

## 2021-05-14 NOTE — Telephone Encounter (Signed)
Per Sharrie Rothman with [facility]:  Ms. Skalla is scheduled for 6/29 for her induction infusion

## 2021-06-18 ENCOUNTER — Encounter: Payer: Self-pay | Admitting: Gastroenterology

## 2021-06-18 ENCOUNTER — Other Ambulatory Visit: Payer: Self-pay

## 2021-06-18 ENCOUNTER — Ambulatory Visit: Payer: 59 | Admitting: Gastroenterology

## 2021-06-18 VITALS — BP 126/85 | HR 78 | Temp 98.5°F | Wt 211.6 lb

## 2021-06-18 DIAGNOSIS — K5100 Ulcerative (chronic) pancolitis without complications: Secondary | ICD-10-CM

## 2021-06-18 DIAGNOSIS — R109 Unspecified abdominal pain: Secondary | ICD-10-CM

## 2021-06-18 MED ORDER — DICYCLOMINE HCL 10 MG PO CAPS: 10.0000 mg | ORAL_CAPSULE | Freq: Three times a day (TID) | ORAL | 0 refills | Status: DC | PRN

## 2021-06-18 NOTE — Progress Notes (Signed)
Dominique Antigua, MD [address]  [place]  [place], [place] [address]  Main: [phone]  Fax: [phone]   Primary Care Physician: Dominique Fuse, MD   Chief complaint: Ulcerative colitis  HPI: Dominique [name] is a 20 y.o. adult here for follow-up of ulcerative pancolitis.  Patient began Entyvio infusion on 05/20/2021.  Is now reporting 2 formed bowel movements a day, with no blood.  Does not have any abdominal pain, but does have some abdominal cramping the day after that that she receives her Entyvio infusions.  Has received 2 infusion so far.  Reports good appetite.  No fever or chills.  No extraintestinal manifestations.  Previous history: Patient was started on prednisone on 02/27/2021 at 40 mg a day and started tapering it slowly on 03/20/2021 and has completed prednisone taper  Index colonoscopy February 24, 2021 Impression:            - Erythematous mucosa in the terminal ileum. Biopsied.                        - Diffuse severe inflammation was found in the entire                        examined colon secondary to pancolitis ulcerative                        colitis. Biopsied. Recommendation:        - Await pathology results.                        - Endoscopic findings suggest Ulcerative Colitis.  DIAGNOSIS:  A. TERMINAL ILEUM; BIOPSY:  - ENTERIC MUCOSA WITH REACTIVE LYMPHOID AGGREGATE.  - NEGATIVE FOR ACTIVE MUCOSAL INFLAMMATION, GRANULOMA AND DYSPLASIA.   B. TERMINAL ILEUM ERYTHEMA; BIOPSY:  - ENTERIC MUCOSA WITH NO EVIDENCE OF ACTIVE MUCOSAL INFLAMMATION.  - NEGATIVE FOR GRANULOMA AND DYSPLASIA.   C. COLON; BIOPSIES:  - CHRONIC ACTIVE MUCOSAL COLITIS WITH ULCERATION.  - FINDINGS CONSISTENT WITH IDIOPATHIC CHRONIC INFLAMMATORY BOWEL DISEASE  (ULCERATIVE COLITIS).  - NEGATIVE FOR DYSPLASIA.   As per previous documentation Entyvio approval process was done and was approved on Mar 27, 2021, but due to changes in insurance, patient did not schedule the  infusion even though it was approved and we were not notified about it until the insurance had already changed.  Therefore, there is a delay in her infusion at this time.  Initial visit on 02/17/2021 Patient had lab tests done due to this, on 02/10/2021, which showed stool test negative for Salmonella, Shigella, Campylobacter, E. coli Shiga toxin, ova, cysts or parasites, Giardia, C. difficile toxins a and B.  Fecal calprotectin was normal at 21  TIBC normal at 302, iron normal at 40, iron saturation mildly low at 13, ferritin.  Hemoglobin normal at 12.8, with normal MCV.  Normal white count. Normal liver enzymes.  TTG IgA negative.  DM.  Creatinine IgA negative.  Total IgA normal.  ESR normal at 24, with reference range being from 0-32.  CRP elevated at 13  After further questioning, patient states that for the last year she has experienced stomach cramping on a frequent basis but attributes this to her anxiety issues.      ROS: All ROS reviewed and negative except as per HPI   Past Medical History:  Diagnosis Date   ADHD (attention deficit  hyperactivity disorder)    Allergy    Anxiety    Auditory hallucination 09/16/2016   Decreased appetite 10/14/2015   Depression    Eating disorder    GAD (generalized anxiety disorder) 12/16/2015   Headache    MDD (major depressive disorder), recurrent episode, severe ([place]) 09/13/2016   Parent-child conflict 3/38/2505   Picky eater 10/14/2015   Seasonal allergies 10/12/2015   Severe episode of recurrent major depressive disorder, without psychotic features ([place]) 10/09/2015   Suicidal ideation 09/14/2016    Past Surgical History:  Procedure Laterality Date   COLONOSCOPY WITH PROPOFOL N/A 02/24/2021   Procedure: COLONOSCOPY WITH PROPOFOL;  Surgeon: Virgel Manifold, MD;  Location: [facility] ENDOSCOPY;  Service: Endoscopy;  Laterality: N/A;   OTHER SURGICAL HISTORY Bilateral ukn   hx tubes in both ears/fell out   TYMPANOSTOMY TUBE PLACEMENT  Bilateral     Prior to Admission medications   Not on File    Family History  Problem Relation Age of Onset   Bipolar disorder Mother        dx at 60   ADD / ADHD Father        undx   ADD / ADHD Brother    Depression Maternal Grandmother    Anxiety disorder Maternal Grandmother    Bipolar disorder Maternal Grandmother      Social History   Tobacco Use   Smoking status: Never   Smokeless tobacco: Never  Vaping Use   Vaping Use: Never used  Substance Use Topics   Alcohol use: No   Drug use: No    Allergies as of 06/18/2021 - Review Complete 06/18/2021  Allergen Reaction Noted   Elemental sulfur Hives 09/17/2018   Omnicef [cefdinir] Hives 10/08/2015    Physical Examination:  Constitutional: General:   Alert,  Well-developed, well-nourished, pleasant and cooperative in NAD BP 126/85   Pulse 78   Temp 98.5 F (36.9 C) (Oral)   Wt 211 lb 9.6 oz (96 kg)   BMI 35.21 kg/m   Respiratory: Normal respiratory effort  Gastrointestinal:  Soft, non-tender and non-distended without masses, hepatosplenomegaly or hernias noted.  No guarding or rebound tenderness.     Cardiac: No clubbing or edema.  No cyanosis. Normal posterior tibial pedal pulses noted.  Psych:  Alert and cooperative. Normal mood and affect.  Musculoskeletal:  Normal gait. Head normocephalic, atraumatic. Symmetrical without gross deformities. 5/5 Lower extremity strength bilaterally.  Skin: Warm. Intact without significant lesions or rashes. No jaundice.  Neck: Supple, trachea midline  Lymph: No cervical lymphadenopathy  Psych:  Alert and oriented x3, Alert and cooperative. Normal mood and affect.  Labs: CMP     Component Value Date/Time   NA 141 09/17/2018 2337   K 3.7 09/17/2018 2337   CL 108 09/17/2018 2337   CO2 26 09/17/2018 2337   GLUCOSE 93 09/17/2018 2337   BUN 12 09/17/2018 2337   CREATININE 0.68 09/17/2018 2337   CALCIUM 9.2 09/17/2018 2337   PROT 7.1 09/17/2018 2337   ALBUMIN  4.2 09/17/2018 2337   AST 17 09/17/2018 2337   ALT 20 09/17/2018 2337   ALKPHOS 77 09/17/2018 2337   BILITOT 0.3 09/17/2018 2337   GFRNONAA NOT CALCULATED 09/17/2018 2337   GFRAA NOT CALCULATED 09/17/2018 2337   Lab Results  Component Value Date   WBC 13.1 09/17/2018   HGB 13.9 09/17/2018   HCT 42.8 09/17/2018   MCV 83.9 09/17/2018   PLT 353 09/17/2018    Imaging Studies:   Assessment and  Plan:   Dominique [name] is a 20 y.o. y/o adult with ulcerative pancolitis diagnosed on colonoscopy in April 2022, started Entyvio infusion on May 20, 2021, here for follow-up  Patient is not having any further blood streaks in stool and has 2 formed bowel movements a day.  Therefore, clinically symptoms have improved  We will check labs at this time, including inflammatory markers, CRP.  We will also check CBC and CMP.  Plan would be to repeat colonoscopy in 2 to 3 months to reevaluate for endoscopic and histologic remission  Patient advised to let us know if symptoms change before next visit and do not continue to improve as they have been since the Entyvio infusion was started  She does not have any further abdominal pain, but does report cramping the day after her Entyvio infusion that last 2 to 3 hours and then resolves.  Can use Bentyl on those dates and as needed prescription was sent to the pharmacy for this  Importance of getting her immunizations up-to-date were discussed with her again just like previous visits and patient was advised to follow-up with PCP in this regard.  Immunization record sent by PCP in May 2022 was reviewed.  See media for the scanned report.  This does show that she got another dose of hep B vaccine in January 2022.  I will recheck hep B antibody at this time as well  The vaccine record does not show that she was immunized for pneumonia.  No recent influenza vaccine either.  Again importance of obtaining these vaccines discussed with the patient and she  states she will follow-up with her PCP about this.    Dr Dominique [name]

## 2021-06-19 LAB — COMPREHENSIVE METABOLIC PANEL
ALT: 13 IU/L (ref 0–32)
AST: 13 IU/L (ref 0–40)
Albumin/Globulin Ratio: 1.7 (ref 1.2–2.2)
Albumin: 4.5 g/dL (ref 3.9–5.0)
Alkaline Phosphatase: 86 IU/L (ref 42–106)
BUN/Creatinine Ratio: 14 (ref 9–23)
BUN: 10 mg/dL (ref 6–20)
Bilirubin Total: <0.2 mg/dL (ref 0.0–1.2)
CO2: 20 mmol/L (ref 20–29)
Calcium: 9.5 mg/dL (ref 8.7–10.2)
Chloride: 104 mmol/L (ref 96–106)
Creatinine, Ser: 0.71 mg/dL (ref 0.57–1.00)
Globulin, Total: 2.6 g/dL (ref 1.5–4.5)
Glucose: 92 mg/dL (ref 65–99)
Potassium: 4.6 mmol/L (ref 3.5–5.2)
Sodium: 140 mmol/L (ref 134–144)
Total Protein: 7.1 g/dL (ref 6.0–8.5)
eGFR: 125 mL/min/{1.73_m2} (ref >59)

## 2021-06-19 LAB — CBC
Hematocrit: 39.3 % (ref 34.0–46.6)
Hemoglobin: 12.4 g/dL (ref 11.1–15.9)
MCH: 24.4 pg — ABNORMAL LOW (ref 26.6–33.0)
MCHC: 31.6 g/dL (ref 31.5–35.7)
MCV: 77 fL — ABNORMAL LOW (ref 79–97)
Platelets: 378 10*3/uL (ref 150–450)
RBC: 5.08 x10E6/uL (ref 3.77–5.28)
RDW: 15.4 % (ref 11.7–15.4)
WBC: 8.6 10*3/uL (ref 3.4–10.8)

## 2021-06-19 LAB — C-REACTIVE PROTEIN: CRP: 3 mg/L (ref 0–10)

## 2021-06-19 LAB — HEPATITIS B SURFACE ANTIBODY,QUALITATIVE: Hep B Surface Ab, Qual: NONREACTIVE

## 2021-06-19 LAB — SPECIMEN STATUS REPORT

## 2021-08-25 ENCOUNTER — Other Ambulatory Visit: Payer: Self-pay

## 2021-08-25 ENCOUNTER — Ambulatory Visit: Payer: 59 | Admitting: Gastroenterology

## 2021-08-25 VITALS — BP 124/77 | HR 74 | Temp 98.9°F | Wt 209.2 lb

## 2021-08-25 DIAGNOSIS — R197 Diarrhea, unspecified: Secondary | ICD-10-CM

## 2021-08-25 DIAGNOSIS — K5100 Ulcerative (chronic) pancolitis without complications: Secondary | ICD-10-CM

## 2021-08-26 NOTE — Progress Notes (Signed)
[name], MD [address]  [place]  [place], [place] [address]  Main: [phone]  Fax: [phone]   Primary Care Physician: Luna Fuse, MD   CC: follow up for colitis   HPI: Dominique [name] is a 20 y.o. adult here for follow-up of ulcerative pancolitis.  Patient is compliant with Entyvio infusion that was initially started on 05/20/2021.  As of the last 1 to 2 weeks is reporting diarrhea, with 2-3 loose bowel movements a day, compared to 1-2 formed bowel movements a day that she was having previously.  Has not seen any blood in stool.  No fever or chills.  Does have some abdominal cramping, but not abdominal pain.  Previous history: Patient was started on prednisone on 02/27/2021 at 40 mg a day and started tapering it slowly on 03/20/2021 and has completed prednisone taper   Index colonoscopy February 24, 2021 Impression:            - Erythematous mucosa in the terminal ileum. Biopsied.                        - Diffuse severe inflammation was found in the entire                        examined colon secondary to pancolitis ulcerative                        colitis. Biopsied. Recommendation:        - Await pathology results.                        - Endoscopic findings suggest Ulcerative Colitis.   DIAGNOSIS:  A. TERMINAL ILEUM; BIOPSY:  - ENTERIC MUCOSA WITH REACTIVE LYMPHOID AGGREGATE.  - NEGATIVE FOR ACTIVE MUCOSAL INFLAMMATION, GRANULOMA AND DYSPLASIA.   B. TERMINAL ILEUM ERYTHEMA; BIOPSY:  - ENTERIC MUCOSA WITH NO EVIDENCE OF ACTIVE MUCOSAL INFLAMMATION.  - NEGATIVE FOR GRANULOMA AND DYSPLASIA.   C. COLON; BIOPSIES:  - CHRONIC ACTIVE MUCOSAL COLITIS WITH ULCERATION.  - FINDINGS CONSISTENT WITH IDIOPATHIC CHRONIC INFLAMMATORY BOWEL DISEASE  (ULCERATIVE COLITIS).  - NEGATIVE FOR DYSPLASIA.    As per previous documentation Entyvio approval process was done and was approved on Mar 27, 2021, but due to changes in insurance, patient did not schedule  the infusion even though it was approved and we were not notified about it until the insurance had already changed.  Therefore, there is a delay in her infusion at this time.   Initial visit on 02/17/2021 Patient had lab tests done due to this, on 02/10/2021, which showed stool test negative for Salmonella, Shigella, Campylobacter, E. coli Shiga toxin, ova, cysts or parasites, Giardia, C. difficile toxins a and B.   Fecal calprotectin was normal at 21   TIBC normal at 302, iron normal at 40, iron saturation mildly low at 13, ferritin.  Hemoglobin normal at 12.8, with normal MCV.  Normal white count. Normal liver enzymes.  TTG IgA negative.  DM.  Creatinine IgA negative.  Total IgA normal.  ESR normal at 24, with reference range being from 0-32.  CRP elevated at 13   After further questioning, patient states that for the last year she has experienced stomach cramping on a frequent basis but attributes this to her anxiety issues.   ROS: All ROS reviewed and negative except as per HPI  Past Medical History:  Diagnosis Date   ADHD (attention deficit hyperactivity disorder)    Allergy    Anxiety    Auditory hallucination 09/16/2016   Decreased appetite 10/14/2015   Depression    Eating disorder    GAD (generalized anxiety disorder) 12/16/2015   Headache    MDD (major depressive disorder), recurrent episode, severe ([place]) 09/13/2016   Parent-child conflict 6/96/2952   Picky eater 10/14/2015   Seasonal allergies 10/12/2015   Severe episode of recurrent major depressive disorder, without psychotic features ([place]) 10/09/2015   Suicidal ideation 09/14/2016    Past Surgical History:  Procedure Laterality Date   COLONOSCOPY WITH PROPOFOL N/A 02/24/2021   Procedure: COLONOSCOPY WITH PROPOFOL;  Surgeon: Virgel Manifold, MD;  Location: [facility] ENDOSCOPY;  Service: Endoscopy;  Laterality: N/A;   OTHER SURGICAL HISTORY Bilateral ukn   hx tubes in both ears/fell out   TYMPANOSTOMY TUBE PLACEMENT  Bilateral     Prior to Admission medications   Medication Sig Start Date End Date Taking? Authorizing Provider  dicyclomine (BENTYL) 10 MG capsule Take 1 capsule (10 mg total) by mouth 3 (three) times daily as needed for up to 30 doses for spasms. 06/18/21   Virgel Manifold, MD    Family History  Problem Relation Age of Onset   Bipolar disorder Mother        dx at 81   ADD / ADHD Father        undx   ADD / ADHD Brother    Depression Maternal Grandmother    Anxiety disorder Maternal Grandmother    Bipolar disorder Maternal Grandmother      Social History   Tobacco Use   Smoking status: Never   Smokeless tobacco: Never  Vaping Use   Vaping Use: Never used  Substance Use Topics   Alcohol use: No   Drug use: No    Allergies as of 08/25/2021 - Review Complete 06/18/2021  Allergen Reaction Noted   Elemental sulfur Hives 09/17/2018   Omnicef [cefdinir] Hives 10/08/2015    Physical Examination:  Constitutional: General:   Alert,  Well-developed, well-nourished, pleasant and cooperative in NAD BP 124/77   Pulse 74   Temp 98.9 F (37.2 C) (Oral)   Wt 209 lb 3.2 oz (94.9 kg)   BMI 34.81 kg/m   Respiratory: Normal respiratory effort  Gastrointestinal:  Soft, non-tender and non-distended without masses, hepatosplenomegaly or hernias noted.  No guarding or rebound tenderness.     Cardiac: No clubbing or edema.  No cyanosis. Normal posterior tibial pedal pulses noted.  Psych:  Alert and cooperative. Normal mood and affect.  Musculoskeletal:  Normal gait. Head normocephalic, atraumatic. Symmetrical without gross deformities. 5/5 Lower extremity strength bilaterally.  Skin: Warm. Intact without significant lesions or rashes. No jaundice.  Neck: Supple, trachea midline  Lymph: No cervical lymphadenopathy  Psych:  Alert and oriented x3, Alert and cooperative. Normal mood and affect.  Labs: CMP     Component Value Date/Time   NA 140 06/18/2021 1353   K 4.6  06/18/2021 1353   CL 104 06/18/2021 1353   CO2 20 06/18/2021 1353   GLUCOSE 92 06/18/2021 1353   GLUCOSE 93 09/17/2018 2337   BUN 10 06/18/2021 1353   CREATININE 0.71 06/18/2021 1353   CALCIUM 9.5 06/18/2021 1353   PROT 7.1 06/18/2021 1353   ALBUMIN 4.5 06/18/2021 1353   AST 13 06/18/2021 1353   ALT 13 06/18/2021 1353   ALKPHOS 86 06/18/2021 1353   BILITOT <0.2 06/18/2021  [place] 09/17/2018 2337   GFRAA NOT CALCULATED 09/17/2018 2337   Lab Results  Component Value Date   WBC 8.6 06/18/2021   HGB 12.4 06/18/2021   HCT 39.3 06/18/2021   MCV 77 (L) 06/18/2021   PLT 378 06/18/2021    Imaging Studies:   Assessment and Plan:   Dominique [name] is a 20 y.o. y/o adult here for follow-up of ulcerative pancolitis, currently on Entyvio, but reporting to 2 loose bowel movements a day for the last 1 to 2 weeks  Will rule out infectious etiology, with GI profile, check for C. difficile, and obtain fecal calprotectin Blood work including CBC, CMP, CRP ordered as well  Continue current Entyvio infusion  If infectious work-up is positive, will treat as necessary, allow for infection to resolve and then subsequent colonoscopy to assess for endoscopic and histologic improvement on Entyvio 6 to 8 weeks later  If infectious work-up is negative, proceed with colonoscopy   Dr [name]

## 2021-08-27 ENCOUNTER — Other Ambulatory Visit: Payer: Self-pay | Admitting: Gastroenterology

## 2021-08-27 DIAGNOSIS — K5100 Ulcerative (chronic) pancolitis without complications: Secondary | ICD-10-CM

## 2021-08-28 LAB — CBC
Hematocrit: 38.1 % (ref 34.0–46.6)
Hemoglobin: 12.2 g/dL (ref 11.1–15.9)
MCH: 25.2 pg — ABNORMAL LOW (ref 26.6–33.0)
MCHC: 32 g/dL (ref 31.5–35.7)
MCV: 79 fL (ref 79–97)
Platelets: 368 10*3/uL (ref 150–450)
RBC: 4.85 x10E6/uL (ref 3.77–5.28)
RDW: 14.2 % (ref 11.7–15.4)
WBC: 10.2 10*3/uL (ref 3.4–10.8)

## 2021-08-28 LAB — COMPREHENSIVE METABOLIC PANEL
ALT: 16 IU/L (ref 0–32)
AST: 16 IU/L (ref 0–40)
Albumin/Globulin Ratio: 2 (ref 1.2–2.2)
Albumin: 4.5 g/dL (ref 3.9–5.0)
Alkaline Phosphatase: 96 IU/L (ref 42–106)
BUN/Creatinine Ratio: 16 (ref 9–23)
BUN: 12 mg/dL (ref 6–20)
Bilirubin Total: 0.4 mg/dL (ref 0.0–1.2)
CO2: 21 mmol/L (ref 20–29)
Calcium: 9.5 mg/dL (ref 8.7–10.2)
Chloride: 102 mmol/L (ref 96–106)
Creatinine, Ser: 0.73 mg/dL (ref 0.57–1.00)
Globulin, Total: 2.3 g/dL (ref 1.5–4.5)
Glucose: 114 mg/dL — ABNORMAL HIGH (ref 70–99)
Potassium: 4.1 mmol/L (ref 3.5–5.2)
Sodium: 141 mmol/L (ref 134–144)
Total Protein: 6.8 g/dL (ref 6.0–8.5)
eGFR: 121 mL/min/{1.73_m2} (ref >59)

## 2021-08-28 LAB — C-REACTIVE PROTEIN: CRP: 5 mg/L (ref 0–10)

## 2021-09-02 MED ORDER — NA SULFATE-K SULFATE-MG SULF 17.5-3.13-1.6 GM/177ML PO SOLN: 1.0000 | Freq: Once | ORAL | 0 refills | Status: AC

## 2021-09-02 NOTE — Addendum Note (Signed)
Addended by: Lurlean Nanny on: 09/02/2021 10:33 AM   Modules accepted: Orders, SmartSet

## 2021-09-05 LAB — GI PROFILE, STOOL, PCR

## 2021-09-05 LAB — CALPROTECTIN, FECAL: Calprotectin, Fecal: <16 ug/g (ref 0–120)

## 2021-09-10 ENCOUNTER — Encounter
Admission: RE | Disposition: A | Discharge status: Home / Self Care | Payer: Self-pay | Source: Home / Self Care | Attending: Gastroenterology

## 2021-09-10 ENCOUNTER — Ambulatory Visit: Payer: 59 | Admitting: Anesthesiology

## 2021-09-10 ENCOUNTER — Ambulatory Visit
Admission: RE | Admit: 2021-09-10 | Discharge: 2021-09-10 | Disposition: A | Discharge status: Home / Self Care | Payer: 59 | Attending: Gastroenterology | Admitting: Gastroenterology

## 2021-09-10 ENCOUNTER — Encounter: Payer: Self-pay | Admitting: Gastroenterology

## 2021-09-10 DIAGNOSIS — Z79899 Other long term (current) drug therapy: Secondary | ICD-10-CM | POA: Insufficient documentation

## 2021-09-10 DIAGNOSIS — K5100 Ulcerative (chronic) pancolitis without complications: Secondary | ICD-10-CM

## 2021-09-10 DIAGNOSIS — Z888 Allergy status to other drugs, medicaments and biological substances status: Secondary | ICD-10-CM | POA: Insufficient documentation

## 2021-09-10 DIAGNOSIS — Z1211 Encounter for screening for malignant neoplasm of colon: Secondary | ICD-10-CM | POA: Diagnosis present

## 2021-09-10 DIAGNOSIS — K648 Other hemorrhoids: Secondary | ICD-10-CM | POA: Insufficient documentation

## 2021-09-10 DIAGNOSIS — R6339 Other feeding difficulties: Secondary | ICD-10-CM | POA: Diagnosis not present

## 2021-09-10 DIAGNOSIS — K5289 Other specified noninfective gastroenteritis and colitis: Secondary | ICD-10-CM | POA: Insufficient documentation

## 2021-09-10 HISTORY — PX: COLONOSCOPY WITH PROPOFOL: SHX5780

## 2021-09-10 LAB — POCT PREGNANCY, URINE: Preg Test, Ur: NEGATIVE

## 2021-09-10 SURGERY — COLONOSCOPY WITH PROPOFOL
Anesthesia: General

## 2021-09-10 MED ORDER — SODIUM CHLORIDE 0.9 % IV SOLN
INTRAVENOUS | Status: DC
  Administered 2021-09-10: 1000 mL via INTRAVENOUS

## 2021-09-10 MED ORDER — PROPOFOL 10 MG/ML IV BOLUS
INTRAVENOUS | Status: DC | PRN
  Administered 2021-09-10: 30 mg via INTRAVENOUS
  Administered 2021-09-10 (×2): 20 mg via INTRAVENOUS
  Administered 2021-09-10: 80 mg via INTRAVENOUS

## 2021-09-10 MED ORDER — PROPOFOL 500 MG/50ML IV EMUL
INTRAVENOUS | Status: DC | PRN
  Administered 2021-09-10: 140 ug/kg/min via INTRAVENOUS

## 2021-09-10 NOTE — Anesthesia Preprocedure Evaluation (Signed)
Anesthesia Evaluation  Patient identified by MRN, date of birth, ID band Patient awake    Reviewed: Allergy & Precautions, NPO status , Patient's Chart, lab work & pertinent test results  History of Anesthesia Complications Negative for: history of anesthetic complications  Airway Mallampati: III  TM Distance: <3 FB Neck ROM: full    Dental  (+) Chipped   Pulmonary neg pulmonary ROS, neg shortness of breath,    Pulmonary exam normal        Cardiovascular Exercise Tolerance: Good (-) anginanegative cardio ROS Normal cardiovascular exam     Neuro/Psych  Headaches, negative psych ROS   GI/Hepatic Neg liver ROS, PUD,   Endo/Other  negative endocrine ROS  Renal/GU negative Renal ROS  negative genitourinary   Musculoskeletal   Abdominal   Peds  Hematology negative hematology ROS (+)   Anesthesia Other Findings Past Medical History: No date: ADHD (attention deficit hyperactivity disorder) No date: Allergy No date: Anxiety 09/16/2016: Auditory hallucination 10/14/2015: Decreased appetite No date: Depression No date: Eating disorder 12/16/2015: GAD (generalized anxiety disorder) No date: Headache 09/13/2016: MDD (major depressive disorder), recurrent episode,  severe ([place]) 01/07/2016: Parent-child conflict 84/16/6063: Picky eater 10/12/2015: Seasonal allergies 10/09/2015: Severe episode of recurrent major depressive disorder,  without psychotic features ([place]) 09/14/2016: Suicidal ideation  Past Surgical History: 02/24/2021: COLONOSCOPY WITH PROPOFOL; N/A     Comment:  Procedure: COLONOSCOPY WITH PROPOFOL;  Surgeon:               Virgel Manifold, MD;  Location: [facility] ENDOSCOPY;                Service: Endoscopy;  Laterality: N/A; ukn: OTHER SURGICAL HISTORY; Bilateral     Comment:  hx tubes in both ears/fell out No date: TYMPANOSTOMY TUBE PLACEMENT; Bilateral  BMI    Body Mass Index: 33.68 kg/m       Reproductive/Obstetrics negative OB ROS                             Anesthesia Physical Anesthesia Plan  ASA: 3  Anesthesia Plan: General   Post-op Pain Management:    Induction: Intravenous  PONV Risk Score and Plan: Propofol infusion and TIVA  Airway Management Planned: Natural Airway and Nasal Cannula  Additional Equipment:   Intra-op Plan:   Post-operative Plan:   Informed Consent: I have reviewed the patients History and Physical, chart, labs and discussed the procedure including the risks, benefits and alternatives for the proposed anesthesia with the patient or authorized representative who has indicated his/her understanding and acceptance.     Dental Advisory Given  Plan Discussed with: Anesthesiologist, CRNA and Surgeon  Anesthesia Plan Comments: (Patient consented for risks of anesthesia including but not limited to:  - adverse reactions to medications - risk of airway placement if required - damage to eyes, teeth, lips or other oral mucosa - nerve damage due to positioning  - sore throat or hoarseness - Damage to heart, brain, nerves, lungs, other parts of body or loss of life  Patient voiced understanding.)        Anesthesia Quick Evaluation
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
## 2021-09-10 NOTE — Transfer of Care (Signed)
Immediate Anesthesia Transfer of Care Note  Patient: Dominique [name]  Procedure(s) Performed: COLONOSCOPY WITH PROPOFOL  Patient Location: PACU  Anesthesia Type:General  Level of Consciousness: sedated  Airway & Oxygen Therapy: Patient Spontanous Breathing and Patient connected to nasal cannula oxygen  Post-op Assessment: Report given to RN and Post -op Vital signs reviewed and stable  Post vital signs: Reviewed and stable  Last Vitals:  Vitals Value Taken Time  BP 99/56 09/10/21 1008  Temp    Pulse 72 09/10/21 1008  Resp 21 09/10/21 1009  SpO2 96 % 09/10/21 1008  Vitals shown include unvalidated device data.  Last Pain:  Vitals:   09/10/21 0918  TempSrc: Temporal  PainSc: 0-No pain         Complications: No notable events documented.

## 2021-09-10 NOTE — Op Note (Signed)
[facility] Gastroenterology Patient Name: Dominique [name] Procedure Date: 09/10/2021 9:28 AM MRN: [id number] Account #: [id number] Date of Birth: [birth year] Admit Type: Outpatient Age: 20 Room: [facility] ENDO ROOM 2 Gender: Female Note Status: Finalized Instrument Name: Jasper Riling [id number] Procedure:             Colonoscopy Indications:           Screening for colorectal malignant neoplasm Providers:             Merrilyn Legler B. Bonna Gains MD, MD Referring MD:          Tivis Ringer. Randel Books, MD (Referring MD) Medicines:             Monitored Anesthesia Care Complications:         No immediate complications. Procedure:             Pre-Anesthesia Assessment:                        - Prior to the procedure, a History and Physical was                         performed, and patient medications, allergies and                         sensitivities were reviewed. The patient's tolerance                         of previous anesthesia was reviewed.                        - The risks and benefits of the procedure and the                         sedation options and risks were discussed with the                         patient. All questions were answered and informed                         consent was obtained.                        - Patient identification and proposed procedure were                         verified prior to the procedure by the physician, the                         nurse, the anesthetist and the technician. The                         procedure was verified in the pre-procedure area in                         the procedure room in the endoscopy suite.                        - ASA Grade Assessment: II - A patient with mild  systemic disease.                        - After reviewing the risks and benefits, the patient                         was deemed in satisfactory condition to undergo the                         procedure.                         After obtaining informed consent, the colonoscope was                         passed under direct vision. Throughout the procedure,                         the patient's blood pressure, pulse, and oxygen                         saturations were monitored continuously. The                         Colonoscope was introduced through the anus and                         advanced to the the terminal ileum. The colonoscopy                         was performed with ease. The patient tolerated the                         procedure well. The quality of the bowel preparation                         was good. Findings:      The perianal and digital rectal examinations were normal.      The rectum, sigmoid colon, descending colon, transverse colon, ascending       colon, cecum and ileum appeared normal. Biopsies were taken with a cold       forceps for histology.      Non-bleeding internal hemorrhoids were found during retroflexion.      Anal papilla(e) were hypertrophied.      No additional abnormalities were found on retroflexion. Impression:            - The rectum, sigmoid colon, descending colon,                         transverse colon, ascending colon, cecum and terminal                         ileum are normal. Biopsied.                        - Non-bleeding internal hemorrhoids.                        - Anal papilla(e) were hypertrophied. Recommendation:        - Discharge patient to home.                        -  Return to GI clinic as previously scheduled.                        - Resume previous diet.                        - Continue present medications.                        - Return to primary care physician as previously                         scheduled.                        - The findings and recommendations were discussed with                         the patient.                        - The findings and recommendations were discussed with                         the  patient's family. Procedure Code(s):     --- Professional ---                        [phone], Colonoscopy, flexible; with biopsy, single or                         multiple Diagnosis Code(s):     --- Professional ---                        Z12.11, Encounter for screening for malignant neoplasm                         of colon CPT copyright 2019 American Medical Association. All rights reserved. The codes documented in this report are preliminary and upon coder review may  be revised to meet current compliance requirements.  [name], MD Margretta Sidle B. Bonna Gains MD, MD 09/10/2021 10:06:23 AM This report has been signed electronically. Number of Addenda: 0 Note Initiated On: 09/10/2021 9:28 AM Scope Withdrawal Time: 0 hours 9 minutes 13 seconds  Total Procedure Duration: 0 hours 12 minutes 52 seconds  Estimated Blood Loss:  Estimated blood loss: none.      [facility]

## 2021-09-11 ENCOUNTER — Encounter: Payer: Self-pay | Admitting: Gastroenterology

## 2021-09-11 LAB — SURGICAL PATHOLOGY

## 2021-09-11 NOTE — Anesthesia Postprocedure Evaluation (Signed)
Anesthesia Post Note  Patient: Verlinda E Ginzburg  Procedure(s) Performed: COLONOSCOPY WITH PROPOFOL  Patient location during evaluation: Endoscopy Anesthesia Type: General Level of consciousness: awake and alert Pain management: pain level controlled Vital Signs Assessment: post-procedure vital signs reviewed and stable Respiratory status: spontaneous breathing, nonlabored ventilation, respiratory function stable and patient connected to nasal cannula oxygen Cardiovascular status: blood pressure returned to baseline and stable Postop Assessment: no apparent nausea or vomiting Anesthetic complications: no   No notable events documented.   Last Vitals:  Vitals:   09/10/21 1018 09/10/21 1028  BP: 107/66   Pulse: 69 (!) 38  Resp: 14 (!) 21  Temp:    SpO2: 97% (!) 85%    Last Pain:  Vitals:   09/10/21 1018  TempSrc:   PainSc: 0-No pain                 Precious Haws Demitrius Crass

## 2021-10-20 ENCOUNTER — Ambulatory Visit (INDEPENDENT_AMBULATORY_CARE_PROVIDER_SITE_OTHER): Payer: 59 | Admitting: Gastroenterology

## 2021-10-20 ENCOUNTER — Encounter: Payer: Self-pay | Admitting: Gastroenterology

## 2021-10-20 VITALS — BP 125/88 | HR 88 | Temp 98.4°F | Wt 206.0 lb

## 2021-10-20 DIAGNOSIS — Z79899 Other long term (current) drug therapy: Secondary | ICD-10-CM | POA: Diagnosis not present

## 2021-10-20 DIAGNOSIS — Z139 Encounter for screening, unspecified: Secondary | ICD-10-CM

## 2021-10-20 DIAGNOSIS — K5100 Ulcerative (chronic) pancolitis without complications: Secondary | ICD-10-CM

## 2021-10-20 NOTE — Progress Notes (Signed)
Dominique Antigua, MD [address]  [place]  [place], [place] [address]  Main: [phone]  Fax: [phone]   Primary Care Physician: Luna Fuse, MD   Chief complaint: Ulcerative colitis follow-up  HPI: Dominique [name] is a 20 y.o. adult here for follow-up of ulcerative pancolitis.  Patient is on Entyvio infusion and doing very well.  She reports 1-2 formed bowel movements a day without blood.  No abdominal pain, nausea or vomiting.  No diarrhea.  No joint pain or vision changes.  Has not received pneumonia vaccination yet.  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  She completed her hepatitis B series as noted under immunization  Most recent colonoscopy October 2022 showed a normal colon and terminal ileum endoscopically.  Nonbleeding internal hemorrhoids and hypertrophied anal papillae were seen.  Pathology showed chronic colitis without activity throughout the colon.  Previous history: Patient was started on prednisone on 02/27/2021 at 40 mg a day and started tapering it slowly on 03/20/2021 and has completed prednisone taper   Index colonoscopy February 24, 2021 Impression:            - Erythematous mucosa in the terminal ileum. Biopsied.                        - Diffuse severe inflammation was found in the entire                        examined colon secondary to pancolitis ulcerative                        colitis. Biopsied. Recommendation:        - Await pathology results.                        - Endoscopic findings suggest Ulcerative Colitis.   DIAGNOSIS:  A. TERMINAL ILEUM; BIOPSY:  - ENTERIC MUCOSA WITH REACTIVE LYMPHOID AGGREGATE.  - NEGATIVE FOR ACTIVE MUCOSAL INFLAMMATION, GRANULOMA AND DYSPLASIA.   B. TERMINAL ILEUM ERYTHEMA; BIOPSY:  - ENTERIC MUCOSA WITH NO EVIDENCE OF  ACTIVE MUCOSAL INFLAMMATION.  - NEGATIVE FOR GRANULOMA AND DYSPLASIA.   C. COLON; BIOPSIES:  - CHRONIC ACTIVE MUCOSAL COLITIS WITH ULCERATION.  - FINDINGS CONSISTENT WITH IDIOPATHIC CHRONIC INFLAMMATORY BOWEL DISEASE  (ULCERATIVE COLITIS).  - NEGATIVE FOR DYSPLASIA.    As per previous documentation Entyvio approval process was done and was approved on Mar 27, 2021, but due to changes in insurance, patient did not schedule the infusion even though it was approved and we were not notified about it until the insurance had already changed.  Therefore, there is a delay in her infusion at this time.   Initial visit on 02/17/2021 Patient had lab tests done due to this, on 02/10/2021, which showed stool test negative for Salmonella, Shigella, Campylobacter, E. coli Shiga toxin, ova, cysts or parasites, Giardia, C. difficile toxins a and B.   Fecal calprotectin was normal at 21   TIBC normal at 302, iron normal at 40, iron saturation mildly low at 13, ferritin.  Hemoglobin normal at 12.8, with normal MCV.  Normal white count. Normal liver enzymes.  TTG IgA negative.  DM.  Creatinine  IgA negative.  Total IgA normal.  ESR normal at 24, with reference range being from 0-32.  CRP elevated at 13   After further questioning, patient states that for the last year she has experienced stomach cramping on a frequent basis but attributes this to her anxiety issues.  ROS: All ROS reviewed and negative except as per HPI   Past Medical History:  Diagnosis Date   ADHD (attention deficit hyperactivity disorder)    Allergy    Anxiety    Auditory hallucination 09/16/2016   Decreased appetite 10/14/2015   Depression    Eating disorder    GAD (generalized anxiety disorder) 12/16/2015   Headache    MDD (major depressive disorder), recurrent episode, severe ([place]) 09/13/2016   Parent-child conflict 9/67/2897   Picky eater 10/14/2015   Seasonal allergies 10/12/2015   Severe episode of recurrent major depressive  disorder, without psychotic features ([place]) 10/09/2015   Suicidal ideation 09/14/2016    Past Surgical History:  Procedure Laterality Date   COLONOSCOPY WITH PROPOFOL N/A 02/24/2021   Procedure: COLONOSCOPY WITH PROPOFOL;  Surgeon: Virgel Manifold, MD;  Location: [facility] ENDOSCOPY;  Service: Endoscopy;  Laterality: N/A;   COLONOSCOPY WITH PROPOFOL N/A 09/10/2021   Procedure: COLONOSCOPY WITH PROPOFOL;  Surgeon: Virgel Manifold, MD;  Location: [facility] ENDOSCOPY;  Service: Endoscopy;  Laterality: N/A;   OTHER SURGICAL HISTORY Bilateral ukn   hx tubes in both ears/fell out   TYMPANOSTOMY TUBE PLACEMENT Bilateral     Prior to Admission medications   Not on File    Family History  Problem Relation Age of Onset   Bipolar disorder Mother        dx at 33   ADD / ADHD Father        undx   ADD / ADHD Brother    Depression Maternal Grandmother    Anxiety disorder Maternal Grandmother    Bipolar disorder Maternal Grandmother      Social History   Tobacco Use   Smoking status: Never   Smokeless tobacco: Never  Vaping Use   Vaping Use: Never used  Substance Use Topics   Alcohol use: No   Drug use: No    Allergies as of 10/20/2021 - Review Complete 10/20/2021  Allergen Reaction Noted   Elemental sulfur Hives 09/17/2018   Omnicef [cefdinir] Hives 10/08/2015    Physical Examination:  Constitutional: General:   Alert,  Well-developed, well-nourished, pleasant and cooperative in NAD BP 125/88   Pulse 88   Temp 98.4 F (36.9 C) (Oral)   Wt 206 lb (93.4 kg)   BMI 34.28 kg/m   Respiratory: Normal respiratory effort  Gastrointestinal:  Soft, non-tender and non-distended without masses, hepatosplenomegaly or hernias noted.  No guarding or rebound tenderness.     Cardiac: No clubbing or edema.  No cyanosis. Normal posterior tibial pedal pulses noted.  Psych:  Alert and cooperative. Normal mood and affect.  Musculoskeletal:  Normal gait. Head normocephalic, atraumatic.  Symmetrical without gross deformities. 5/5 Lower extremity strength bilaterally.  Skin: Warm. Intact without significant lesions or rashes. No jaundice.  Neck: Supple, trachea midline  Lymph: No cervical lymphadenopathy  Psych:  Alert and oriented x3, Alert and cooperative. Normal mood and affect.  Labs: CMP     Component Value Date/Time   NA 141 08/27/2021 1338   K 4.1 08/27/2021 1338   CL 102 08/27/2021 1338   CO2 21 08/27/2021 1338   GLUCOSE 114 (H) 08/27/2021 1338   GLUCOSE 93 09/17/2018 2337  BUN 12 08/27/2021 1338   CREATININE 0.73 08/27/2021 1338   CALCIUM 9.5 08/27/2021 1338   PROT 6.8 08/27/2021 1338   ALBUMIN 4.5 08/27/2021 1338   AST 16 08/27/2021 1338   ALT 16 08/27/2021 1338   ALKPHOS 96 08/27/2021 1338   BILITOT 0.4 08/27/2021 1338   GFRNONAA NOT CALCULATED 09/17/2018 2337   GFRAA NOT CALCULATED 09/17/2018 2337   Lab Results  Component Value Date   WBC 10.2 08/27/2021   HGB 12.2 08/27/2021   HCT 38.1 08/27/2021   MCV 79 08/27/2021   PLT 368 08/27/2021    Imaging Studies:   Assessment and Plan:   Dominique [name] is a 20 y.o. y/o adult here for follow-up of ulcerative pancolitis, and clinical, endoscopic and histologic remission on Entyvio  Most recent labs were done in October 2022 and were reassuring No extraintestinal manifestations present Patient again encouraged to complete pneumonia vaccination and she states she is working on it, to be done at [facility] or primary care provider.  Since she is with the pediatrician and is transitioning to an adult PCP, she states there has been some delays in getting this done  If patient's bowel movement pattern changes, or she has abdominal pain, fever chills, extraintestinal manifestations or any other reason for concern, she was advised to call us.  Follow-up in 3 months or earlier if needed  Encouraged to follow-up with PCP for regular health maintenance stuff such as Pap smears completed as well.   Will refer to dermatology for annual skin exam as well  Dr Dominique [name]

## 2021-10-22 NOTE — Addendum Note (Signed)
Addended by: Lurlean Nanny on: 10/22/2021 11:52 AM   Modules accepted: Orders

## 2021-12-31 ENCOUNTER — Ambulatory Visit (INDEPENDENT_AMBULATORY_CARE_PROVIDER_SITE_OTHER): Payer: 59 | Admitting: Advanced Practice Midwife

## 2021-12-31 ENCOUNTER — Encounter: Payer: Self-pay | Admitting: Advanced Practice Midwife

## 2021-12-31 ENCOUNTER — Other Ambulatory Visit: Payer: Self-pay

## 2021-12-31 ENCOUNTER — Other Ambulatory Visit (HOSPITAL_COMMUNITY)
Admission: RE | Admit: 2021-12-31 | Discharge: 2021-12-31 | Disposition: A | Discharge status: Home / Self Care | Payer: 59 | Source: Ambulatory Visit | Attending: Advanced Practice Midwife | Admitting: Advanced Practice Midwife

## 2021-12-31 VITALS — BP 145/85 | HR 62 | Ht 65.0 in | Wt 205.0 lb

## 2021-12-31 DIAGNOSIS — Z01419 Encounter for gynecological examination (general) (routine) without abnormal findings: Secondary | ICD-10-CM | POA: Diagnosis present

## 2021-12-31 DIAGNOSIS — Z8659 Personal history of other mental and behavioral disorders: Secondary | ICD-10-CM

## 2021-12-31 DIAGNOSIS — Z62810 Personal history of physical and sexual abuse in childhood: Secondary | ICD-10-CM

## 2021-12-31 DIAGNOSIS — N926 Irregular menstruation, unspecified: Secondary | ICD-10-CM | POA: Diagnosis not present

## 2021-12-31 NOTE — Progress Notes (Signed)
Since pt started her cycles that have been very irregular, can at times be 1 or 2 year at the least , the most is 4 times a year. And bleeding is irregular as well

## 2022-01-01 LAB — CYTOLOGY - PAP
Chlamydia: NEGATIVE
Comment: NEGATIVE
Comment: NEGATIVE
Comment: NORMAL
Diagnosis: NEGATIVE
Neisseria Gonorrhea: NEGATIVE
Trichomonas: NEGATIVE
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
## 2022-02-17 ENCOUNTER — Ambulatory Visit: Payer: 59 | Admitting: Gastroenterology

## 2022-02-17 ENCOUNTER — Encounter: Payer: Self-pay | Admitting: Gastroenterology

## 2022-02-17 VITALS — BP 122/75 | HR 66 | Temp 98.2°F | Ht 65.0 in | Wt 210.1 lb

## 2022-02-17 DIAGNOSIS — K5100 Ulcerative (chronic) pancolitis without complications: Secondary | ICD-10-CM | POA: Diagnosis not present

## 2022-02-17 MED ORDER — SHINGRIX 50 MCG/0.5ML IM SUSR: 0.5000 mL | Freq: Once | INTRAMUSCULAR | 0 refills | Status: AC

## 2022-02-17 NOTE — Progress Notes (Signed)
?  ?Cephas Darby, MD ?[address]  ?[address]  ?[place], [place] [address]  ?Main: [phone]  ?Fax: [phone] ? ? ? ?Gastroenterology Consultation ? ?Referring Provider:     Luna Fuse, MD ?Primary Care Physician:  Luna Fuse, MD ?Primary Gastroenterologist:  Dr. Bonna Gains ?Reason for Consultation:     Ulcerative colitis ?      ? HPI:   ?Dominique [name] is a 21 y.o. adult referred by Dr. Randel Books, Tivis Ringer, MD  for consultation & management of ulcerative colitis ? ?IBD diagnosis: Ulcerative pancolitis diagnosed based on the colonoscopy on 02/24/2021.  Pathology revealed chronic active mucosal colitis in the entire colon with no evidence of dysplasia.  ? ?Disease course: At the time of diagnosis, patient was on short course of prednisone followed by initiation of Entyvio every 8 weeks and has been doing well.  She underwent repeat colonoscopy in 10/22 which revealed chronic inactive colitis and was normal endoscopically.  Fecal calprotectin levels were normal. ? ?Extra intestinal manifestations: None ? ?IBD surgical history: None ? ?Imaging: ? ?MRE none ?CTE none ?SBFT none ? ?Procedures: ?Index colonoscopy February 24, 2021 ?Impression:            - Erythematous mucosa in the terminal ileum. Biopsied. ?                       - Diffuse severe inflammation was found in the entire ?                       examined colon secondary to pancolitis ulcerative ?                       colitis. Biopsied. ?  ?DIAGNOSIS:  ?A. TERMINAL ILEUM; BIOPSY:  ?- ENTERIC MUCOSA WITH REACTIVE LYMPHOID AGGREGATE.  ?- NEGATIVE FOR ACTIVE MUCOSAL INFLAMMATION, GRANULOMA AND DYSPLASIA.  ? ?B. TERMINAL ILEUM ERYTHEMA; BIOPSY:  ?- ENTERIC MUCOSA WITH NO EVIDENCE OF ACTIVE MUCOSAL INFLAMMATION.  ?- NEGATIVE FOR GRANULOMA AND DYSPLASIA.  ? ?C. COLON; BIOPSIES:  ?- CHRONIC ACTIVE MUCOSAL COLITIS WITH ULCERATION.  ?- FINDINGS CONSISTENT WITH IDIOPATHIC CHRONIC INFLAMMATORY BOWEL DISEASE  ?(ULCERATIVE COLITIS).  ?- NEGATIVE  FOR DYSPLASIA.  ? ?Colonoscopy 09/10/2021 ?- The rectum, sigmoid colon, descending colon, transverse colon, ascending colon, cecum and terminal ileum are ?normal. Biopsied. ?- Non-bleeding internal hemorrhoids. ?- Anal papilla(e) were hypertrophied. ?DIAGNOSIS:  ?A. COLON, CECUM AND ASCENDING; COLD BIOPSY:  ?- CHRONIC COLITIS WITHOUT ACTIVITY.  ?- NEGATIVE FOR GRANULOMA, DYSPLASIA, AND MALIGNANCY.  ? ?B. COLON, TRANSVERSE; COLD BIOPSY:  ?- CHRONIC COLITIS WITHOUT ACTIVITY.  ?- NEGATIVE FOR GRANULOMA, DYSPLASIA, AND MALIGNANCY.  ? ?C. COLON, DESCENDING; COLD BIOPSY:  ?- CHRONIC COLITIS WITHOUT ACTIVITY.  ?- NEGATIVE FOR GRANULOMA, DYSPLASIA, AND MALIGNANCY.  ? ?D. COLON, RECTOSIGMOID; COLD BIOPSY:  ?- CHRONIC COLITIS WITHOUT ACTIVITY.  ?- NEGATIVE FOR GRANULOMA, DYSPLASIA, AND MALIGNANCY.  ? ?Upper Endoscopy none ? ?VCE none ? ?IBD medications: ? ?Steroids: Yes prednisone, responded well, no budesonide ?5-ASA: None mesalamine ?Immunomodulators: AZA, methotrexate ?TPMT status unknown ?Biologics:  ?Anti TNFs: ?Anti Integrins: Entyvio initiated in May 2022, every 8 weeks ?Ustekinumab: ?Tofactinib: ?Clinical trial: ? ? ?NSAIDs: None ? ?Antiplts/Anticoagulants/Anti thrombotics: None ? ?Past Medical History:  ?Diagnosis Date  ? ADHD (attention deficit hyperactivity disorder)   ? Allergy   ? Anxiety   ? Auditory hallucination 09/16/2016  ? Decreased appetite 10/14/2015  ? Depression   ? Eating disorder   ?  GAD (generalized anxiety disorder) 12/16/2015  ? Headache   ? MDD (major depressive disorder), recurrent episode, severe ([place]) 09/13/2016  ? Parent-child conflict 1/61/0960  ? Picky eater 10/14/2015  ? Seasonal allergies 10/12/2015  ? Severe episode of recurrent major depressive disorder, without psychotic features ([place]) 10/09/2015  ? Suicidal ideation 09/14/2016  ? ? ?Past Surgical History:  ?Procedure Laterality Date  ? COLONOSCOPY WITH PROPOFOL N/A 02/24/2021  ? Procedure: COLONOSCOPY WITH PROPOFOL;  Surgeon: Virgel Manifold, MD;  Location: [facility] ENDOSCOPY;  Service: Endoscopy;  Laterality: N/A;  ? COLONOSCOPY WITH PROPOFOL N/A 09/10/2021  ? Procedure: COLONOSCOPY WITH PROPOFOL;  Surgeon: Virgel Manifold, MD;  Location: [facility] ENDOSCOPY;  Service: Endoscopy;  Laterality: N/A;  ? OTHER SURGICAL HISTORY Bilateral ukn  ? hx tubes in both ears/fell out  ? TYMPANOSTOMY TUBE PLACEMENT Bilateral   ? ?Current Outpatient Medications:  ?  vedolizumab (ENTYVIO) 300 MG injection, Inject into the vein., Disp: , Rfl:  ? ? ? ?Family History  ?Problem Relation Age of Onset  ? Bipolar disorder Mother   ?     dx at 68  ? ADD / ADHD Father   ?     undx  ? ADD / ADHD Brother   ? Depression Maternal Grandmother   ? Anxiety disorder Maternal Grandmother   ? Bipolar disorder Maternal Grandmother   ?  ? ?Social History  ? ?Tobacco Use  ? Smoking status: Never  ? Smokeless tobacco: Never  ?Vaping Use  ? Vaping Use: Never used  ?Substance Use Topics  ? Alcohol use: No  ? Drug use: No  ? ? ?Allergies as of 02/17/2022 - Review Complete 02/17/2022  ?Allergen Reaction Noted  ? Elemental sulfur Hives 09/17/2018  ? Omnicef [cefdinir] Hives 10/08/2015  ? ? ?Review of Systems:    ?All systems reviewed and negative except where noted in HPI. ? ? Physical Exam:  ?BP 122/75 (BP Location: Left Arm, Patient Position: Sitting, Cuff Size: Normal)   Pulse 66   Temp 98.2 ?F (36.8 ?C) (Oral)   Ht '5\' 5"'$  (1.651 m)   Wt 210 lb 2 oz (95.3 kg)   BMI 34.97 kg/m?  ?No LMP recorded. (Menstrual status: Irregular Periods). ? ?General:   Alert,  Well-developed, well-nourished, pleasant and cooperative in NAD ?Head:  Normocephalic and atraumatic. ?Eyes:  Sclera clear, no icterus.   Conjunctiva pink. ?Ears:  Normal auditory acuity. ?Nose:  No deformity, discharge, or lesions. ?Mouth:  No deformity or lesions,oropharynx pink & moist. ?Neck:  Supple; no masses or thyromegaly. ?Lungs:  Respirations even and unlabored.  Clear throughout to auscultation.   No wheezes, crackles, or  rhonchi. No acute distress. ?Heart:  Regular rate and rhythm; no murmurs, clicks, rubs, or gallops. ?Abdomen:  Normal bowel sounds. Soft, non-tender and non-distended without masses, hepatosplenomegaly or hernias noted.  No guarding or rebound tenderness.   ?Rectal: Not performed ?Msk:  Symmetrical without gross deformities. Good, equal movement & strength bilaterally. ?Pulses:  Normal pulses noted. ?Extremities:  No clubbing or edema.  No cyanosis. ?Neurologic:  Alert and oriented x3;  grossly normal neurologically. ?Skin:  Intact without significant lesions or rashes. No jaundice. ?Psych:  Alert and cooperative. Normal mood and affect. ? ?Imaging Studies: ?No abdominal imaging ? ?Assessment and Plan:  ? ?Dominique [name] is a 21 y.o. adult with ulcerative pancolitis diagnosed in 02/2021 currently in clinical and endoscopic remission maintained on Entyvio every 8 weeks ? ?Recommend CBC and LFTs with every other infusion ?Immunizations are  up-to-date ?Recommend Shingrix vaccine, prescription sent to her pharmacy ?Pap up-to-date, cytology negative ? ? ?Follow up every 6 months ? ? ?Cephas Darby, MD ? ?

## 2022-02-17 NOTE — Addendum Note (Signed)
Addended by: Ulyess Blossom L on: 02/17/2022 04:37 PM ? ? Modules accepted: Orders ? ?

## 2022-04-22 ENCOUNTER — Ambulatory Visit: Payer: 59 | Admitting: Dermatology

## 2022-04-22 DIAGNOSIS — L7211 Pilar cyst: Secondary | ICD-10-CM

## 2022-04-22 NOTE — Patient Instructions (Signed)
Pre-Operative Instructions  You are scheduled for a surgical procedure at [facility]. We recommend you read the following instructions. If you have any questions or concerns, please call the office at [phone].  Shower and wash the entire body with soap and water the day of your surgery paying special attention to cleansing at and around the planned surgery site.  Avoid aspirin or aspirin containing products at least fourteen (14) days prior to your surgical procedure and for at least one week (7 Days) after your surgical procedure. If you take aspirin on a regular basis for heart disease or history of stroke or for any other reason, we may recommend you continue taking aspirin but please notify us if you take this on a regular basis. Aspirin can cause more bleeding to occur during surgery as well as prolonged bleeding and bruising after surgery.   Avoid other nonsteroidal pain medications at least one week prior to surgery and at least one week prior to your surgery. These include medications such as Ibuprofen (Motrin, Advil and Nuprin), Naprosyn, Voltaren, Relafen, etc. If medications are used for therapeutic reasons, please inform us as they can cause increased bleeding or prolonged bleeding during and bruising after surgical procedures.   Please advise [place] if you are taking any "blood thinner" medications such as Coumadin or Dipyridamole or Plavix or similar medications. These cause increased bleeding and prolonged bleeding during procedures and bruising after surgical procedures. We may have to consider discontinuing these medications briefly prior to and shortly after your surgery if safe to do so.   Please inform us of all medications you are currently taking. All medications that are taken regularly should be taken the day of surgery as you always do. Nevertheless, we need to be informed of what medications you are taking prior to surgery to know whether they will affect the  procedure or cause any complications.   Please inform us of any medication allergies. Also inform us of whether you have allergies to Latex or rubber products or whether you have had any adverse reaction to Lidocaine or Epinephrine.  Please inform us of any prosthetic or artificial body parts such as artificial heart valve, joint replacements, etc., or similar condition that might require preoperative antibiotics.   We recommend avoidance of alcohol at least two weeks prior to surgery and continued avoidance for at least two weeks after surgery.   We recommend discontinuation of tobacco smoking at least two weeks prior to surgery and continued abstinence for at least two weeks after surgery.  Do not plan strenuous exercise, strenuous work or strenuous lifting for approximately four weeks after your surgery.   We request if you are unable to make your scheduled surgical appointment, please call us at least a week in advance or as soon as you are aware of a problem so that we can cancel or reschedule the appointment.   You MAY TAKE TYLENOL (acetaminophen) for pain as it is not a blood thinner.   PLEASE PLAN TO BE IN TOWN FOR TWO WEEKS FOLLOWING SURGERY, THIS IS IMPORTANT SO YOU CAN BE CHECKED FOR DRESSING CHANGES, SUTURE REMOVAL AND TO MONITOR FOR POSSIBLE COMPLICATIONS.   If You Need Anything After Your Visit  If you have any questions or concerns for your doctor, please call our main line at [phone] and press option 4 to reach your doctor's medical assistant. If no one answers, please leave a voicemail as directed and we will return your call as soon as  possible. Messages left after 4 pm will be answered the following business day.   You may also send [place] a message via [place]. We typically respond to MyChart messages within 1-2 business days.  For prescription refills, please ask your pharmacy to contact our office. Our fax number is [phone].  If you have an urgent issue when the  clinic is closed that cannot wait until the next business day, you can page your doctor at the number below.    Please note that while we do our best to be available for urgent issues outside of office hours, we are not available 24/7.   If you have an urgent issue and are unable to reach [place], you may choose to seek medical care at your doctor's office, retail clinic, urgent care center, or emergency room.  If you have a medical emergency, please immediately call 911 or go to the emergency department.  Pager Numbers  - Dr. Nehemiah Massed: [phone]  - Dr. Laurence Ferrari: [phone]  - Dr. Nicole Kindred: [phone]  In the event of inclement weather, please call our main line at [phone] for an update on the status of any delays or closures.  Dermatology Medication Tips: Please keep the boxes that topical medications come in in order to help keep track of the instructions about where and how to use these. Pharmacies typically print the medication instructions only on the boxes and not directly on the medication tubes.   If your medication is too expensive, please contact our office at [phone] option 4 or send [place] a message through [place].   We are unable to tell what your co-pay for medications will be in advance as this is different depending on your insurance coverage. However, we may be able to find a substitute medication at lower cost or fill out paperwork to get insurance to cover a needed medication.   If a prior authorization is required to get your medication covered by your insurance company, please allow [place] 1-2 business days to complete this process.  Drug prices often vary depending on where the prescription is filled and some pharmacies may offer cheaper prices.  The website www.goodrx.com contains coupons for medications through different pharmacies. The prices here do not account for what the cost may be with help from insurance (it may be cheaper with your insurance), but the  website can give you the price if you did not use any insurance.  - You can print the associated coupon and take it with your prescription to the pharmacy.  - You may also stop by our office during regular business hours and pick up a GoodRx coupon card.  - If you need your prescription sent electronically to a different pharmacy, notify our office through [facility] or by phone at [phone] option 4.     Si Usted Necesita Algo Despus de Su Visita  Tambin puede enviarnos un mensaje a travs de Pharmacist, community. Por lo general respondemos a los mensajes de MyChart en el transcurso de 1 a 2 das hbiles.  Para renovar recetas, por favor pida a su farmacia que se ponga en contacto con nuestra oficina. Harland Dingwall de fax es [place] [phone].  Si tiene un asunto urgente cuando la clnica est cerrada y que no puede esperar hasta el siguiente da hbil, puede llamar/localizar a su doctor(a) al nmero que aparece a continuacin.   Por favor, tenga en cuenta que aunque hacemos todo lo posible para estar disponibles para asuntos urgentes fuera del horario de [place],  no estamos disponibles las 24 horas del da, los 7 das de la [place].   Si tiene un problema urgente y no puede comunicarse con nosotros, puede optar por buscar atencin mdica  en el consultorio de su doctor(a), en una clnica privada, en un centro de atencin urgente o en una sala de emergencias.  Si tiene Engineering geologist, por favor llame inmediatamente al 911 o vaya a la sala de emergencias.  Nmeros de bper  - Dr. Nehemiah Massed: [phone]  - Dra. Moye: [phone]  - Dra. Nicole Kindred: [phone]  En caso de inclemencias del [place], por favor llame a [place]ie Kindred principal al [phone] para una actualizacin sobre el [place] de cualquier retraso o cierre.  Consejos para la medicacin en dermatologa: Por favor, guarde las cajas en las que vienen los medicamentos de uso tpico para ayudarle a seguir las  instrucciones sobre dnde y cmo usarlos. Las farmacias generalmente imprimen las instrucciones del medicamento slo en las cajas y no directamente en los tubos del [place].   Si su medicamento es muy caro, por favor, pngase en contacto con Zigmund Daniel llamando al [phone] y presione la opcin 4 o envenos un mensaje a travs de Pharmacist, community.   No podemos decirle cul ser su copago por los medicamentos por adelantado ya que esto es diferente dependiendo de la cobertura de su seguro. Sin embargo, es posible que podamos encontrar un medicamento sustituto a Electrical engineer un formulario para que el seguro cubra el medicamento que se considera necesario.   Si se requiere una autorizacin previa para que su compaa de seguros [place] su medicamento, por favor permtanos de 1 a 2 das hbiles para completar este proceso.  Los precios de los medicamentos varan con frecuencia dependiendo del Environmental consultant de dnde se surte la receta y alguna farmacias pueden ofrecer precios ms baratos.  El sitio web www.goodrx.com tiene cupones para medicamentos de Airline pilot. Los precios aqu no tienen en cuenta lo que podra costar con la ayuda del seguro (puede ser ms barato con su seguro), pero el sitio web puede darle el precio si no utiliz Research scientist (physical sciences).  - Puede imprimir el cupn correspondiente y llevarlo con su receta a la farmacia.  - Tambin puede pasar por nuestra oficina durante el horario de atencin regular y Charity fundraiser una tarjeta de cupones de GoodRx.  - Si necesita que su receta se enve electrnicamente a una farmacia diferente, informe a nuestra oficina a travs de MyChart de [place] o por telfono llamando al [phone] y presione la opcin 4.

## 2022-04-22 NOTE — Progress Notes (Signed)
   New Patient Visit  Subjective  Kathryn is a 21 y.o. adult who presents for the following: Skin Problem (New patient here today for bumps at scalp that do get sore, get bigger then smaller. Patient advises she has had them for years. ).  The following portions of the chart were reviewed this encounter and updated as appropriate:   Tobacco  Allergies  Meds  Problems  Med Hx  Surg Hx  Fam Hx     Review of Systems:  No other skin or systemic complaints except as noted in HPI or Assessment and Plan.  Objective  Well appearing patient in no apparent distress; mood and affect are within normal limits.  A focused examination was performed including scalp. Relevant physical exam findings are noted in the Assessment and Plan.  Scalp Subcutaneous nodule at scalp x 5   Assessment & Plan  Pilar cyst Scalp  Benign-appearing. Exam most consistent with a pilar cyst. Discussed that a cyst is a benign growth that can grow over time and sometimes get irritated or inflamed. Recommend observation if it is not bothersome. Discussed option of surgical excision to remove it if it is growing, symptomatic, or other changes noted. Please call for new or changing lesions so they can be evaluated. Patient advised we will need to do 1 excision at that time. Return for Surgery.  Graciella Belton, RMA, am acting as scribe for Sarina Ser, MD . Documentation: I have reviewed the above documentation for accuracy and completeness, and I agree with the above.  Sarina Ser, MD

## 2022-05-02 ENCOUNTER — Encounter: Payer: Self-pay | Admitting: Dermatology

## 2022-06-18 ENCOUNTER — Encounter: Payer: Self-pay | Admitting: Gastroenterology

## 2022-06-21 ENCOUNTER — Encounter: Payer: Self-pay | Admitting: Gastroenterology

## 2022-06-21 ENCOUNTER — Ambulatory Visit (INDEPENDENT_AMBULATORY_CARE_PROVIDER_SITE_OTHER): Payer: 59 | Admitting: Gastroenterology

## 2022-06-21 ENCOUNTER — Other Ambulatory Visit: Payer: Self-pay

## 2022-06-21 VITALS — BP 138/85 | HR 83 | Temp 98.5°F | Ht 65.0 in | Wt 219.4 lb

## 2022-06-21 DIAGNOSIS — K5100 Ulcerative (chronic) pancolitis without complications: Secondary | ICD-10-CM | POA: Diagnosis not present

## 2022-06-21 NOTE — Progress Notes (Signed)
Dominique Darby, MD [address]  [place]  [place], [place] [address]  Main: [phone]  Fax: [phone]    Gastroenterology Consultation  Referring Provider:     Luna Fuse, MD Primary Care Physician:  Luna Fuse, MD Primary Gastroenterologist:  Dr. Bonna Gains Reason for Consultation:     Ulcerative colitis        HPI:   Dominique [name] is a 21 y.o. adult referred by Dr. Randel Books, Tivis Ringer, MD  for consultation & management of ulcerative colitis.  Patient has been doing well from [facility] standpoint, receiving Entyvio every 8 weeks.  She reports that her last infusion which was last week, patient developed rash on her face about 2 to 3 hours after her Entyvio infusion.  She took Benadryl and the rash subsided.  She denies any other symptoms.  This the first time she noticed rash after the infusion.  She does not have any GI symptoms today.  She did not receive Shingrix vaccine yet  IBD diagnosis: Ulcerative pancolitis diagnosed based on the colonoscopy on 02/24/2021.  Pathology revealed chronic active mucosal colitis in the entire colon with no evidence of dysplasia.   Disease course: At the time of diagnosis, patient was on short course of prednisone followed by initiation of Entyvio every 8 weeks and has been doing well.  She underwent repeat colonoscopy in 10/22 which revealed chronic inactive colitis and was normal endoscopically.  Fecal calprotectin levels were normal.  Extra intestinal manifestations: None  IBD surgical history: None  Imaging:  MRE none CTE none SBFT none  Procedures: Index colonoscopy February 24, 2021 Impression:            - Erythematous mucosa in the terminal ileum. Biopsied.                        - Diffuse severe inflammation was found in the entire                        examined colon secondary to pancolitis ulcerative                        colitis. Biopsied.   DIAGNOSIS:  A. TERMINAL ILEUM; BIOPSY:  - ENTERIC MUCOSA WITH  REACTIVE LYMPHOID AGGREGATE.  - NEGATIVE FOR ACTIVE MUCOSAL INFLAMMATION, GRANULOMA AND DYSPLASIA.   B. TERMINAL ILEUM ERYTHEMA; BIOPSY:  - ENTERIC MUCOSA WITH NO EVIDENCE OF ACTIVE MUCOSAL INFLAMMATION.  - NEGATIVE FOR GRANULOMA AND DYSPLASIA.   C. COLON; BIOPSIES:  - CHRONIC ACTIVE MUCOSAL COLITIS WITH ULCERATION.  - FINDINGS CONSISTENT WITH IDIOPATHIC CHRONIC INFLAMMATORY BOWEL DISEASE  (ULCERATIVE COLITIS).  - NEGATIVE FOR DYSPLASIA.   Colonoscopy 09/10/2021 - The rectum, sigmoid colon, descending colon, transverse colon, ascending colon, cecum and terminal ileum are normal. Biopsied. - Non-bleeding internal hemorrhoids. - Anal papilla(e) were hypertrophied. DIAGNOSIS:  A. COLON, CECUM AND ASCENDING; COLD BIOPSY:  - CHRONIC COLITIS WITHOUT ACTIVITY.  - NEGATIVE FOR GRANULOMA, DYSPLASIA, AND MALIGNANCY.   B. COLON, TRANSVERSE; COLD BIOPSY:  - CHRONIC COLITIS WITHOUT ACTIVITY.  - NEGATIVE FOR GRANULOMA, DYSPLASIA, AND MALIGNANCY.   C. COLON, DESCENDING; COLD BIOPSY:  - CHRONIC COLITIS WITHOUT ACTIVITY.  - NEGATIVE FOR GRANULOMA, DYSPLASIA, AND MALIGNANCY.   D. COLON, RECTOSIGMOID; COLD BIOPSY:  - CHRONIC COLITIS WITHOUT ACTIVITY.  - NEGATIVE FOR GRANULOMA, DYSPLASIA, AND MALIGNANCY.   Upper Endoscopy none  VCE none  IBD medications:  Steroids:  Yes prednisone, responded well, no budesonide 5-ASA: None mesalamine Immunomodulators: AZA, methotrexate TPMT status unknown Biologics:  Anti TNFs: Anti Integrins: Entyvio initiated in May 2022, every 8 weeks Ustekinumab: Tofactinib: Clinical trial:   NSAIDs: None  Antiplts/Anticoagulants/Anti thrombotics: None  Past Medical History:  Diagnosis Date   ADHD (attention deficit hyperactivity disorder)    Allergy    Anxiety    Auditory hallucination 09/16/2016   Decreased appetite 10/14/2015   Depression    Eating disorder    GAD (generalized anxiety disorder) 12/16/2015   Headache    MDD (major depressive  disorder), recurrent episode, severe ([place]) 09/13/2016   Parent-child conflict 3/50/0938   Picky eater 10/14/2015   Seasonal allergies 10/12/2015   Severe episode of recurrent major depressive disorder, without psychotic features ([place]) 10/09/2015   Suicidal ideation 09/14/2016    Past Surgical History:  Procedure Laterality Date   COLONOSCOPY WITH PROPOFOL N/A 02/24/2021   Procedure: COLONOSCOPY WITH PROPOFOL;  Surgeon: Virgel Manifold, MD;  Location: [facility] ENDOSCOPY;  Service: Endoscopy;  Laterality: N/A;   COLONOSCOPY WITH PROPOFOL N/A 09/10/2021   Procedure: COLONOSCOPY WITH PROPOFOL;  Surgeon: Virgel Manifold, MD;  Location: [facility] ENDOSCOPY;  Service: Endoscopy;  Laterality: N/A;   OTHER SURGICAL HISTORY Bilateral ukn   hx tubes in both ears/fell out   TYMPANOSTOMY TUBE PLACEMENT Bilateral    Current Outpatient Medications:    vedolizumab (ENTYVIO) 300 MG injection, Inject into the vein., Disp: , Rfl:    nortriptyline (PAMELOR) 10 MG capsule, Start Nortriptyline (Pamelor) 10 mg nightly for one week, then increase to 20 mg nightly (Patient not taking: Reported on 06/21/2022), Disp: , Rfl:     Family History  Problem Relation Age of Onset   Bipolar disorder Mother        dx at 83   ADD / ADHD Father        undx   ADD / ADHD Brother    Depression Maternal Grandmother    Anxiety disorder Maternal Grandmother    Bipolar disorder Maternal Grandmother      Social History   Tobacco Use   Smoking status: Never   Smokeless tobacco: Never  Vaping Use   Vaping Use: Never used  Substance Use Topics   Alcohol use: No   Drug use: No    Allergies as of 06/21/2022 - Review Complete 06/21/2022  Allergen Reaction Noted   Elemental sulfur Hives 09/17/2018   Omnicef [cefdinir] Hives 10/08/2015    Review of Systems:    All systems reviewed and negative except where noted in HPI.   Physical Exam:  BP 138/85 (BP Location: Left Arm, Patient Position: Sitting, Cuff Size:  Normal)   Pulse 83   Temp 98.5 F (36.9 C) (Oral)   Ht '5\' 5"'$  (1.651 m)   Wt 219 lb 6 oz (99.5 kg)   BMI 36.51 kg/m  No LMP recorded. (Menstrual status: Irregular Periods).  General:   Alert,  Well-developed, well-nourished, pleasant and cooperative in NAD Head:  Normocephalic and atraumatic. Eyes:  Sclera clear, no icterus.   Conjunctiva pink. Ears:  Normal auditory acuity. Nose:  No deformity, discharge, or lesions. Mouth:  No deformity or lesions,oropharynx pink & moist. Neck:  Supple; no masses or thyromegaly. Lungs:  Respirations even and unlabored.  Clear throughout to auscultation.   No wheezes, crackles, or rhonchi. No acute distress. Heart:  Regular rate and rhythm; no murmurs, clicks, rubs, or gallops. Abdomen:  Normal bowel sounds. Soft, non-tender and non-distended without masses, hepatosplenomegaly or  hernias noted.  No guarding or rebound tenderness.   Rectal: Not performed Msk:  Symmetrical without gross deformities. Good, equal movement & strength bilaterally. Pulses:  Normal pulses noted. Extremities:  No clubbing or edema.  No cyanosis. Neurologic:  Alert and oriented x3;  grossly normal neurologically. Skin:  Intact without significant lesions or rashes. No jaundice. Psych:  Alert and cooperative. Normal mood and affect.  Imaging Studies: No abdominal imaging  Assessment and Plan:   Dominique [name] is a 21 y.o. adult with ulcerative pancolitis diagnosed in 02/2021 currently in clinical and endoscopic remission maintained on Entyvio every 8 weeks.  Patient developed a rash after the last Entyvio infusion.  Advised patient to notify me if she develops rash with future infusions  Recommend CBC and LFTs with every other infusion Immunizations are up-to-date Recommend Shingrix vaccine, prescription sent to her pharmacy Pap up-to-date, cytology negative   Follow up annually  Dominique Darby, MD

## 2022-06-29 ENCOUNTER — Encounter: Payer: Self-pay | Admitting: Dermatology

## 2022-06-29 ENCOUNTER — Ambulatory Visit: Payer: 59 | Admitting: Dermatology

## 2022-06-29 DIAGNOSIS — D485 Neoplasm of uncertain behavior of skin: Secondary | ICD-10-CM

## 2022-06-29 DIAGNOSIS — L720 Epidermal cyst: Secondary | ICD-10-CM

## 2022-06-29 MED ORDER — MUPIROCIN 2 % EX OINT
1.0000 | TOPICAL_OINTMENT | Freq: Every day | CUTANEOUS | 0 refills | Status: DC
Start: 2022-06-29 — End: 2023-07-04

## 2022-06-29 NOTE — Progress Notes (Signed)
Follow-Up Visit   Subjective  Dominique [name] is a 21 y.o. adult who presents for the following: Cyst (Scalp - Excise today).  The following portions of the chart were reviewed this encounter and updated as appropriate:   Tobacco  Allergies  Meds  Problems  Med Hx  Surg Hx  Fam Hx     Review of Systems:  No other skin or systemic complaints except as noted in HPI or Assessment and Plan.  Objective  Well appearing patient in no apparent distress; mood and affect are within normal limits.  A focused examination was performed including scalp. Relevant physical exam findings are noted in the Assessment and Plan.  Left frontal scalp medial 1.1 cm cystic papule  Left frontal scalp lat 1.5 cm cystic papule   Assessment & Plan  Neoplasm of uncertain behavior of skin (2) Left frontal scalp medial  Skin excision  Lesion length (cm):  1.1 Lesion width (cm):  1.1 Margin per side (cm):  0 Total excision diameter (cm):  1.1 Informed consent: discussed and consent obtained   Timeout: patient name, date of birth, surgical site, and procedure verified   Procedure prep:  Patient was prepped and draped in usual sterile fashion Prep type:  Isopropyl alcohol and povidone-iodine Anesthesia: the lesion was anesthetized in a standard fashion   Anesthetic:  1% lidocaine w/ epinephrine 1-100,000 buffered w/ 8.4% NaHCO3 Instrument used: #15 blade   Hemostasis achieved with: pressure   Hemostasis achieved with comment:  Electrocautery Outcome: patient tolerated procedure well with no complications   Post-procedure details: sterile dressing applied and wound care instructions given   Dressing type: bandage and pressure dressing (mupirocin)    Skin repair Complexity:  Simple Final length (cm):  1.1 Undermining comment:  Undermining defect 1.1 cm Fine/surface layer approximation (top stitches):  Suture size:  3-0 Suture type: nylon   Stitches: horizontal mattress   Suture removal  (days):  7 Hemostasis achieved with: suture and pressure Outcome: patient tolerated procedure well with no complications   Post-procedure details: sterile dressing applied and wound care instructions given   Dressing type: bandage and pressure dressing (mupirocin)    mupirocin ointment (BACTROBAN) 2 % Apply 1 Application topically daily. With dressing changes  Specimen 1 - Surgical pathology Differential Diagnosis: Cyst vs other Check Margins: No  Left frontal scalp lat  Skin excision  Lesion length (cm):  1.5 Lesion width (cm):  1.5 Margin per side (cm):  0 Total excision diameter (cm):  1.5 Informed consent: discussed and consent obtained   Timeout: patient name, date of birth, surgical site, and procedure verified   Procedure prep:  Patient was prepped and draped in usual sterile fashion Prep type:  Isopropyl alcohol and povidone-iodine Anesthesia: the lesion was anesthetized in a standard fashion   Anesthetic:  1% lidocaine w/ epinephrine 1-100,000 buffered w/ 8.4% NaHCO3 Instrument used: #15 blade   Hemostasis achieved with: pressure   Hemostasis achieved with comment:  Electrocautery Outcome: patient tolerated procedure well with no complications   Post-procedure details: sterile dressing applied and wound care instructions given   Dressing type: bandage and pressure dressing (mupirocin)    Skin repair Complexity:  Simple Final length (cm):  1.5 Undermining comment:  Undermining defect 1.5 cm Fine/surface layer approximation (top stitches):  Suture size:  3-0 Suture type: nylon   Stitches: horizontal mattress   Suture removal (days):  7 Hemostasis achieved with: suture and pressure Outcome: patient tolerated procedure well with no complications   Post-procedure  details: sterile dressing applied and wound care instructions given   Dressing type: bandage and pressure dressing (mupirocin)    Specimen 2 - Surgical pathology Differential Diagnosis: Cyst vs  other Check Margins: No   Return in about 1 week (around 07/06/2022) for suture removal.  I, Ashok Cordia, CMA, am acting as scribe for Sarina Ser, MD . Documentation: I have reviewed the above documentation for accuracy and completeness, and I agree with the above.  Sarina Ser, MD

## 2022-06-29 NOTE — Patient Instructions (Signed)

## 2022-06-30 ENCOUNTER — Telehealth: Payer: Self-pay

## 2022-06-30 NOTE — Telephone Encounter (Signed)
Left voicemail for patient to return my call. I was calling to check on her post-op and make sure she was doing well with no complications.

## 2022-07-06 ENCOUNTER — Ambulatory Visit: Payer: 59 | Admitting: Dermatology

## 2022-07-06 DIAGNOSIS — L7211 Pilar cyst: Secondary | ICD-10-CM

## 2022-07-06 NOTE — Patient Instructions (Signed)
Due to recent changes in healthcare laws, you may see results of your pathology and/or laboratory studies on MyChart before the doctors have had a chance to review them. We understand that in some cases there may be results that are confusing or concerning to you. Please understand that not all results are received at the same time and often the doctors may need to interpret multiple results in order to provide you with the best plan of care or course of treatment. Therefore, we ask that you please give [place] 2 business days to thoroughly review all your results before contacting the office for clarification. Should we see a critical lab result, you will be contacted sooner.   If You Need Anything After Your Visit  If you have any questions or concerns for your doctor, please call our main line at [phone] and press option 4 to reach your doctor's medical assistant. If no one answers, please leave a voicemail as directed and we will return your call as soon as possible. Messages left after 4 pm will be answered the following business day.   You may also send [place] a message via [place]. We typically respond to MyChart messages within 1-2 business days.  For prescription refills, please ask your pharmacy to contact our office. Our fax number is [phone].  If you have an urgent issue when the clinic is closed that cannot wait until the next business day, you can page your doctor at the number below.    Please note that while we do our best to be available for urgent issues outside of office hours, we are not available 24/7.   If you have an urgent issue and are unable to reach [place], you may choose to seek medical care at your doctor's office, retail clinic, urgent care center, or emergency room.  If you have a medical emergency, please immediately call 911 or go to the emergency department.  Pager Numbers  - Dr. Nehemiah Massed: [phone]  - Dr. Laurence Ferrari: [phone]  - Dr. Nicole Kindred:  [phone]  In the event of inclement weather, please call our main line at [phone] for an update on the status of any delays or closures.  Dermatology Medication Tips: Please keep the boxes that topical medications come in in order to help keep track of the instructions about where and how to use these. Pharmacies typically print the medication instructions only on the boxes and not directly on the medication tubes.   If your medication is too expensive, please contact our office at [phone] option 4 or send [place] a message through [place].   We are unable to tell what your co-pay for medications will be in advance as this is different depending on your insurance coverage. However, we may be able to find a substitute medication at lower cost or fill out paperwork to get insurance to cover a needed medication.   If a prior authorization is required to get your medication covered by your insurance company, please allow [place] 1-2 business days to complete this process.  Drug prices often vary depending on where the prescription is filled and some pharmacies may offer cheaper prices.  The website www.goodrx.com contains coupons for medications through different pharmacies. The prices here do not account for what the cost may be with help from insurance (it may be cheaper with your insurance), but the website can give you the price if you did not use any insurance.  - You can print the associated coupon and take it with  your prescription to the pharmacy.  - You may also stop by our office during regular business hours and pick up a GoodRx coupon card.  - If you need your prescription sent electronically to a different pharmacy, notify our office through [facility] or by phone at [phone] option 4.     Si Usted Necesita Algo Despus de Su Visita  Tambin puede enviarnos un mensaje a travs de Pharmacist, community. Por lo general respondemos a los mensajes de MyChart en el transcurso de 1 a 2  das hbiles.  Para renovar recetas, por favor pida a su farmacia que se ponga en contacto con nuestra oficina. Harland Dingwall de fax es [place] [phone].  Si tiene un asunto urgente cuando la clnica est cerrada y que no puede esperar hasta el siguiente da hbil, puede llamar/localizar a su doctor(a) al nmero que aparece a continuacin.   Por favor, tenga en cuenta que aunque hacemos todo lo posible para estar disponibles para asuntos urgentes fuera del horario de [place], no estamos disponibles las 24 horas del da, los 7 das de la [place].   Si tiene un problema urgente y no puede comunicarse con nosotros, puede optar por buscar atencin mdica  en el consultorio de su doctor(a), en una clnica privada, en un centro de atencin urgente o en una sala de emergencias.  Si tiene Engineering geologist, por favor llame inmediatamente al 911 o vaya a la sala de emergencias.  Nmeros de bper  - Dr. Nehemiah Massed: [phone]  - Dra. Moye: [phone]  - Dra. Nicole Kindred: [phone]  En caso de inclemencias del [place], por favor llame a Johnsie Kindred principal al [phone] para una actualizacin sobre el [place] de cualquier retraso o cierre.  Consejos para la medicacin en dermatologa: Por favor, guarde las cajas en las que vienen los medicamentos de uso tpico para ayudarle a seguir las instrucciones sobre dnde y cmo usarlos. Las farmacias generalmente imprimen las instrucciones del medicamento slo en las cajas y no directamente en los tubos del [place].   Si su medicamento es muy caro, por favor, pngase en contacto con Zigmund Daniel llamando al [phone] y presione la opcin 4 o envenos un mensaje a travs de Pharmacist, community.   No podemos decirle cul ser su copago por los medicamentos por adelantado ya que esto es diferente dependiendo de la cobertura de su seguro. Sin embargo, es posible que podamos encontrar un medicamento sustituto a Electrical engineer un formulario para que el  seguro cubra el medicamento que se considera necesario.   Si se requiere una autorizacin previa para que su compaa de seguros [place] su medicamento, por favor permtanos de 1 a 2 das hbiles para completar este proceso.  Los precios de los medicamentos varan con frecuencia dependiendo del Environmental consultant de dnde se surte la receta y alguna farmacias pueden ofrecer precios ms baratos.  El sitio web www.goodrx.com tiene cupones para medicamentos de Airline pilot. Los precios aqu no tienen en cuenta lo que podra costar con la ayuda del seguro (puede ser ms barato con su seguro), pero el sitio web puede darle el precio si no utiliz Research scientist (physical sciences).  - Puede imprimir el cupn correspondiente y llevarlo con su receta a la farmacia.  - Tambin puede pasar por nuestra oficina durante el horario de atencin regular y Charity fundraiser una tarjeta de cupones de GoodRx.  - Si necesita que su receta se enve electrnicamente a Chiropodist, informe a nuestra oficina a travs [place]  o por telfono llamando al [phone] y presione la opcin 4.

## 2022-07-13 NOTE — Progress Notes (Signed)
   Follow-Up Visit   Subjective  Dominique [name] is a 21 y.o. adult who presents for the following: Cyst (Post op - left frontal scalp medial, left frontal scalp lateral).   The following portions of the chart were reviewed this encounter and updated as appropriate:       Review of Systems:  No other skin or systemic complaints except as noted in HPI or Assessment and Plan.  Objective  Well appearing patient in no apparent distress; mood and affect are within normal limits.  A focused examination was performed including scalp. Relevant physical exam findings are noted in the Assessment and Plan.  left frontal scalp medial, left frontal scalp lateral Healing excisions sites    Assessment & Plan  Pilar cyst left frontal scalp medial, left frontal scalp lateral  Encounter for Removal of Sutures - Incision site at the  left frontal scalp medial, left frontal scalp lateral is clean, dry and intact - Wound cleansed, sutures removed, wound cleansed and steri strips applied.  - Discussed pathology results showing Pilar cyst x 2  - Patient advised to keep steri-strips dry until they fall off. - Scars remodel for a full year. - Once steri-strips fall off, patient can apply over-the-counter silicone scar cream each night to help with scar remodeling if desired. - Patient advised to call with any concerns or if they notice any new or changing lesions.    Return if symptoms worsen or fail to improve.  I, Ashok Cordia, CMA, am acting as scribe for Sarina Ser, MD .

## 2022-07-13 NOTE — Progress Notes (Deleted)
   Follow-Up Visit   Subjective  [place] is a 21 y.o. adult who presents for the following: Cyst (Post op - left frontal scalp medial, left frontal scalp lateral).    The following portions of the chart were reviewed this encounter and updated as appropriate:       Review of Systems:  No other skin or systemic complaints except as noted in HPI or Assessment and Plan.  Objective  Well appearing patient in no apparent distress; mood and affect are within normal limits.  A focused examination was performed including scalp. Relevant physical exam findings are noted in the Assessment and Plan.  left frontal scalp medial, left frontal scalp lateral Healing excisions sites    Assessment & Plan  Pilar cyst left frontal scalp medial, left frontal scalp lateral  Encounter for Removal of Sutures - Incision site at the  left frontal scalp medial, left frontal scalp lateral is clean, dry and intact - Wound cleansed, sutures removed, wound cleansed and steri strips applied.  - Discussed pathology results showing Pilar cyst x 2  - Patient advised to keep steri-strips dry until they fall off. - Scars remodel for a full year. - Once steri-strips fall off, patient can apply over-the-counter silicone scar cream each night to help with scar remodeling if desired. - Patient advised to call with any concerns or if they notice any new or changing lesions.    Return if symptoms worsen or fail to improve.  I, Ashok Cordia, CMA, am acting as scribe for Sarina Ser, MD .

## 2022-10-22 ENCOUNTER — Encounter: Payer: Self-pay | Admitting: Gastroenterology

## 2023-04-21 ENCOUNTER — Other Ambulatory Visit: Payer: Self-pay

## 2023-06-16 ENCOUNTER — Telehealth: Payer: Self-pay

## 2023-06-16 NOTE — Telephone Encounter (Signed)
We received a fax from [facility] for patient infusion. Patient is due for a appointment because her last appointment was 06/22/2023. Called and left message and sent mychart message today and on 05/09/2023

## 2023-06-22 ENCOUNTER — Other Ambulatory Visit: Payer: Self-pay

## 2023-06-22 ENCOUNTER — Telehealth: Payer: Self-pay

## 2023-06-22 NOTE — Telephone Encounter (Signed)
-----   Message from [facility] sent at 06/22/2023 12:41 PM EDT ----- Labs look normal, we can renew her [place] She is also past due for her annual follow-up  RV

## 2023-06-22 NOTE — Telephone Encounter (Signed)
Tried to call patient and left a message for call back. Sent mychart message

## 2023-06-23 NOTE — Telephone Encounter (Signed)
Called and left a message for call back  

## 2023-06-24 NOTE — Telephone Encounter (Signed)
Made appointment for 07/12/2023

## 2023-07-04 ENCOUNTER — Other Ambulatory Visit: Payer: Self-pay

## 2023-07-12 ENCOUNTER — Encounter: Payer: Self-pay | Admitting: Gastroenterology

## 2023-07-12 ENCOUNTER — Ambulatory Visit: Payer: Managed Care, Other (non HMO) | Admitting: Gastroenterology

## 2023-07-12 VITALS — BP 125/83 | HR 81 | Temp 98.1°F | Ht 65.0 in | Wt 226.0 lb

## 2023-07-12 DIAGNOSIS — K5100 Ulcerative (chronic) pancolitis without complications: Secondary | ICD-10-CM | POA: Diagnosis not present

## 2023-07-12 NOTE — Progress Notes (Signed)
Arlyss Repress, MD [address]  [address]  [place], [place] [address]  Main: [phone]  Fax: [phone]    Gastroenterology Consultation  Referring Provider:     Chrys Racer, MD Primary Care Physician:  Chrys Racer, MD Primary Gastroenterologist:  Dr. Lannette Donath Reason for Consultation:     Ulcerative Pancolitis        HPI:   Dominique [name] is a 22 y.o. adult referred by Dr. Suzie Portela, Gwendalyn Ege, MD  for consultation & management of ulcerative colitis.  Patient has been doing well from [facility] standpoint, receiving Entyvio every 8 weeks.  She does not have any GI symptoms today.  She did not receive Shingrix vaccine yet  IBD diagnosis: Ulcerative pancolitis diagnosed based on the colonoscopy on 02/24/2021.  Pathology revealed chronic active mucosal colitis in the entire colon with no evidence of dysplasia.   Disease course: At the time of diagnosis, patient was on short course of prednisone followed by initiation of Entyvio every 8 weeks and has been doing well.  She underwent repeat colonoscopy in 10/22 which revealed chronic inactive colitis and was normal endoscopically.  Fecal calprotectin levels were normal.  Extra intestinal manifestations: None  IBD surgical history: None  Imaging:  MRE none CTE none SBFT none  Procedures: Index colonoscopy February 24, 2021 Impression:            - Erythematous mucosa in the terminal ileum. Biopsied.                        - Diffuse severe inflammation was found in the entire                        examined colon secondary to pancolitis ulcerative                        colitis. Biopsied.   DIAGNOSIS:  A. TERMINAL ILEUM; BIOPSY:  - ENTERIC MUCOSA WITH REACTIVE LYMPHOID AGGREGATE.  - NEGATIVE FOR ACTIVE MUCOSAL INFLAMMATION, GRANULOMA AND DYSPLASIA.   B. TERMINAL ILEUM ERYTHEMA; BIOPSY:  - ENTERIC MUCOSA WITH NO EVIDENCE OF ACTIVE MUCOSAL INFLAMMATION.  - NEGATIVE FOR GRANULOMA AND DYSPLASIA.   C. COLON;  BIOPSIES:  - CHRONIC ACTIVE MUCOSAL COLITIS WITH ULCERATION.  - FINDINGS CONSISTENT WITH IDIOPATHIC CHRONIC INFLAMMATORY BOWEL DISEASE  (ULCERATIVE COLITIS).  - NEGATIVE FOR DYSPLASIA.   Colonoscopy 09/10/2021 - The rectum, sigmoid colon, descending colon, transverse colon, ascending colon, cecum and terminal ileum are normal. Biopsied. - Non-bleeding internal hemorrhoids. - Anal papilla(e) were hypertrophied. DIAGNOSIS:  A. COLON, CECUM AND ASCENDING; COLD BIOPSY:  - CHRONIC COLITIS WITHOUT ACTIVITY.  - NEGATIVE FOR GRANULOMA, DYSPLASIA, AND MALIGNANCY.   B. COLON, TRANSVERSE; COLD BIOPSY:  - CHRONIC COLITIS WITHOUT ACTIVITY.  - NEGATIVE FOR GRANULOMA, DYSPLASIA, AND MALIGNANCY.   C. COLON, DESCENDING; COLD BIOPSY:  - CHRONIC COLITIS WITHOUT ACTIVITY.  - NEGATIVE FOR GRANULOMA, DYSPLASIA, AND MALIGNANCY.   D. COLON, RECTOSIGMOID; COLD BIOPSY:  - CHRONIC COLITIS WITHOUT ACTIVITY.  - NEGATIVE FOR GRANULOMA, DYSPLASIA, AND MALIGNANCY.   Upper Endoscopy none  VCE none  IBD medications:  Steroids: Yes prednisone, responded well, no budesonide 5-ASA: None mesalamine Immunomodulators: AZA, methotrexate TPMT status unknown Biologics:  Anti TNFs: Anti Integrins: Entyvio initiated in May 2022, every 8 weeks Ustekinumab: Tofactinib: Clinical trial:   NSAIDs: None  Antiplts/Anticoagulants/Anti thrombotics: None  Past Medical History:  Diagnosis Date  ADHD (attention deficit hyperactivity disorder)    Allergy    Anxiety    Auditory hallucination 09/16/2016   Decreased appetite 10/14/2015   Depression    Eating disorder    GAD (generalized anxiety disorder) 12/16/2015   Headache    MDD (major depressive disorder), recurrent episode, severe (HCC) 09/13/2016   Parent-child conflict 01/07/2016   Picky eater 10/14/2015   Seasonal allergies 10/12/2015   Severe episode of recurrent major depressive disorder, without psychotic features (HCC) 10/09/2015   Suicidal ideation  09/14/2016    Past Surgical History:  Procedure Laterality Date   COLONOSCOPY WITH PROPOFOL N/A 02/24/2021   Procedure: COLONOSCOPY WITH PROPOFOL;  Surgeon: Pasty Spillers, MD;  Location: [facility] ENDOSCOPY;  Service: Endoscopy;  Laterality: N/A;   COLONOSCOPY WITH PROPOFOL N/A 09/10/2021   Procedure: COLONOSCOPY WITH PROPOFOL;  Surgeon: Pasty Spillers, MD;  Location: [facility] ENDOSCOPY;  Service: Endoscopy;  Laterality: N/A;   OTHER SURGICAL HISTORY Bilateral ukn   hx tubes in both ears/fell out   TYMPANOSTOMY TUBE PLACEMENT Bilateral    Current Outpatient Medications:    BD POSIFLUSH 0.9 % SOLN injection, Inject 10 mLs into the vein as needed., Disp: , Rfl:    nortriptyline (PAMELOR) 10 MG capsule, , Disp: , Rfl:    nortriptyline (PAMELOR) 10 MG capsule, Take 20 mg by mouth at bedtime., Disp: , Rfl:    QUEtiapine (SEROQUEL) 25 MG tablet, Take 25 mg by mouth at bedtime. Seroquel 25 mg nightly for one week, then increase to 50 mg nightly for mood., Disp: , Rfl:    SUMAtriptan (IMITREX) 100 MG tablet, Take 100 mg by mouth every 2 (two) hours as needed for migraine., Disp: , Rfl:    traZODone (DESYREL) 50 MG tablet, Take 75 mg by mouth at bedtime., Disp: , Rfl:    vedolizumab (ENTYVIO) 300 MG injection, Inject into the vein., Disp: , Rfl:    zonisamide (ZONEGRAN) 25 MG capsule, Take 25 mg by mouth daily. (Patient not taking: Reported on 07/12/2023), Disp: , Rfl:     Family History  Problem Relation Age of Onset   Bipolar disorder Mother        dx at 64   ADD / ADHD Father        undx   ADD / ADHD Brother    Depression Maternal Grandmother    Anxiety disorder Maternal Grandmother    Bipolar disorder Maternal Grandmother      Social History   Tobacco Use   Smoking status: Never   Smokeless tobacco: Never  Vaping Use   Vaping status: Never Used  Substance Use Topics   Alcohol use: No   Drug use: No    Allergies as of 07/12/2023 - Review Complete 07/12/2023  Allergen  Reaction Noted   Elemental sulfur Hives 09/17/2018   Omnicef [cefdinir] Hives 10/08/2015    Review of Systems:    All systems reviewed and negative except where noted in HPI.   Physical Exam:  BP 139/84 (BP Location: Right Arm, Patient Position: Sitting, Cuff Size: Large)   Pulse 78   Temp 98.1 F (36.7 C) (Oral)   Ht 5\' 5"  (1.651 m)   Wt 226 lb (102.5 kg)   BMI 37.61 kg/m  No LMP recorded. (Menstrual status: Irregular Periods).  General:   Alert,  Well-developed, well-nourished, pleasant and cooperative in NAD Head:  Normocephalic and atraumatic. Eyes:  Sclera clear, no icterus.   Conjunctiva pink. Ears:  Normal auditory acuity. Nose:  No deformity, discharge,  or lesions. Mouth:  No deformity or lesions,oropharynx pink & moist. Neck:  Supple; no masses or thyromegaly. Lungs:  Respirations even and unlabored.  Clear throughout to auscultation.   No wheezes, crackles, or rhonchi. No acute distress. Heart:  Regular rate and rhythm; no murmurs, clicks, rubs, or gallops. Abdomen:  Normal bowel sounds. Soft, non-tender and non-distended without masses, hepatosplenomegaly or hernias noted.  No guarding or rebound tenderness.   Rectal: Not performed Msk:  Symmetrical without gross deformities. Good, equal movement & strength bilaterally. Pulses:  Normal pulses noted. Extremities:  No clubbing or edema.  No cyanosis. Neurologic:  Alert and oriented x3;  grossly normal neurologically. Skin:  Intact without significant lesions or rashes. No jaundice. Psych:  Alert and cooperative. Normal mood and affect.  Imaging Studies: No abdominal imaging  Assessment and Plan:   Dominique [name] is a 22 y.o. adult with ulcerative pancolitis diagnosed in 02/2021 currently in clinical and endoscopic remission maintained on Entyvio every 8 weeks.    Recommend CBC and LFTs with every other infusion Immunizations are up-to-date Pap up-to-date, cytology negative   Follow up annually  Arlyss Repress, MD

## 2023-07-18 ENCOUNTER — Telehealth: Payer: Self-pay

## 2023-07-18 NOTE — Telephone Encounter (Signed)
-----   Message from [facility] Ginger F sent at 07/12/2023  3:07 PM EDT ----- Dr. Allegra Lai wanted me to message you regarding blood work for this patient. When she goes for her infusions, she would like a CBC and LFT's EVERY OTHER infusion.

## 2023-07-18 NOTE — Telephone Encounter (Signed)
Sent a email to [employer] Sherrilyn Rist of the change in blood work with [employer] infusion

## 2023-08-12 ENCOUNTER — Telehealth: Payer: Self-pay

## 2023-08-12 NOTE — Telephone Encounter (Signed)
Per Sherrilyn Rist with [facility] infusion Note for [name]     Subject: Nursing Communication - Patient: Dominique [name] Patient DOB: [birth year] Author: Mervin Kung Primary RN: Tomasita Morrow, RN Care Team: [facility] [place] Physician: Lannette Donath, MD Primary Payor: [employer]  Pt had infusion yesterday 08/11/23 and tolerated without issue. Next infusion date is around the week of 10/03/23. Advised pt that [name] would reach out to schedule.

## 2023-10-01 ENCOUNTER — Ambulatory Visit: Payer: Managed Care, Other (non HMO)

## 2023-10-01 ENCOUNTER — Ambulatory Visit
Admission: EM | Admit: 2023-10-01 | Discharge: 2023-10-01 | Disposition: A | Discharge status: Home / Self Care | Payer: Managed Care, Other (non HMO) | Attending: Emergency Medicine | Admitting: Emergency Medicine

## 2023-10-01 ENCOUNTER — Encounter: Payer: Self-pay | Admitting: *Deleted

## 2023-10-01 DIAGNOSIS — M25531 Pain in right wrist: Secondary | ICD-10-CM

## 2023-10-01 DIAGNOSIS — W19XXXA Unspecified fall, initial encounter: Secondary | ICD-10-CM | POA: Diagnosis not present

## 2023-10-01 HISTORY — DX: Polycystic ovarian syndrome: E28.2

## 2023-10-01 MED ORDER — PREDNISONE 20 MG PO TABS: 40.0000 mg | ORAL_TABLET | Freq: Every day | ORAL | 0 refills | Status: AC

## 2023-10-01 MED ORDER — ACETAMINOPHEN-CODEINE 300-30 MG PO TABS: 1.0000 | ORAL_TABLET | Freq: Four times a day (QID) | ORAL | 0 refills | Status: AC | PRN

## 2023-10-01 NOTE — ED Triage Notes (Signed)
Pt reports [place] onto right hand [place]. C/O pain to right wrist and right index finger and thumb. Some swelling noted. Pt had coban wrap in place from paramedic on scene last night. Right thumb with some numbness, but all digits warm, pink with prompt cap refill.

## 2023-10-01 NOTE — Discharge Instructions (Addendum)
X-rays pending, you will be notified of results tomorrow morning via telephone  You have been placed in a wrist brace that stability and support, wear at all times until you were given x-ray result   Begin prednisone every morning with food for 5 days to help reduce swelling and pain, may take Tylenol in addition to this  May take Tylenol 3 which has codeine in it for severe pain, please be mindful this will make you feel sleepy  If not broken then you may wear as needed until pain resolves  If there is a break in the bone you will need to wear at all times until evaluated by orthopedic specialist

## 2023-10-02 ENCOUNTER — Telehealth: Payer: Self-pay | Admitting: Emergency Medicine

## 2023-10-02 NOTE — ED Provider Notes (Signed)
Renaldo Fiddler    CSN: [id number] Arrival date & time: 10/01/23  1414      History   Chief Complaint Chief Complaint  Patient presents with   Fall    HPI Dominique [name] is a 22 y.o. adult.   Patient presents for evaluation of right wrist pain extending into the hand and the forearm beginning 1 day ago after injury.  Works at the [employer] and was standing on a 3 foot high platform when she fell landing with the palm down.  Pain primarily along the radial aspect, has limited range of motion to the first and second digit.  Endorses numbness to the right thumb.  Has not attempted treatment of symptoms.  Past Medical History:  Diagnosis Date   ADHD (attention deficit hyperactivity disorder)    Allergy    Anxiety    Auditory hallucination 09/16/2016   Decreased appetite 10/14/2015   Depression    Eating disorder    GAD (generalized anxiety disorder) 12/16/2015   Headache    MDD (major depressive disorder), recurrent episode, severe (HCC) 09/13/2016   Parent-child conflict 01/07/2016   PCOS (polycystic ovarian syndrome)    Picky eater 10/14/2015   Seasonal allergies 10/12/2015   Severe episode of recurrent major depressive disorder, without psychotic features (HCC) 10/09/2015   Suicidal ideation 09/14/2016    Patient Active Problem List   Diagnosis Date Noted   Ulcerative pancolitis without complication (HCC)    Borderline abnormal thyroid function test 04/12/2016   MDD (major depressive disorder), recurrent episode, moderate (HCC) 03/15/2016   Gender dysphoria 01/07/2016   Attention deficit hyperactivity disorder (ADHD) 12/16/2015   Seasonal allergies 10/12/2015   Paranoia (HCC)     Past Surgical History:  Procedure Laterality Date   COLONOSCOPY WITH PROPOFOL N/A 02/24/2021   Procedure: COLONOSCOPY WITH PROPOFOL;  Surgeon: Pasty Spillers, MD;  Location: [facility] ENDOSCOPY;  Service: Endoscopy;  Laterality: N/A;   COLONOSCOPY WITH PROPOFOL N/A  09/10/2021   Procedure: COLONOSCOPY WITH PROPOFOL;  Surgeon: Pasty Spillers, MD;  Location: [facility] ENDOSCOPY;  Service: Endoscopy;  Laterality: N/A;   OTHER SURGICAL HISTORY Bilateral ukn   hx tubes in both ears/fell out   TYMPANOSTOMY TUBE PLACEMENT Bilateral     OB History     Gravida  0   Para  0   Term  0   Preterm  0   AB  0   Living  0      SAB  0   IAB  0   Ectopic  0   Multiple  0   Live Births  0            Home Medications    Prior to Admission medications   Medication Sig Start Date End Date Taking? Authorizing Provider  acetaminophen-codeine (TYLENOL #3) 300-30 MG tablet Take 1-2 tablets by mouth every 6 (six) hours as needed for moderate pain (pain score 4-6). 10/01/23  Yes Sanyah Molnar R, NP  predniSONE (DELTASONE) 20 MG tablet Take 2 tablets (40 mg total) by mouth daily. 10/01/23  Yes Lanitra Battaglini, Elita Boone, NP  SUMAtriptan (IMITREX) 100 MG tablet Take 100 mg by mouth every 2 (two) hours as needed for migraine. 02/03/23  Yes [provider]  traZODone (DESYREL) 50 MG tablet Take 75 mg by mouth at bedtime. 08/23/22  Yes [provider]  vedolizumab (ENTYVIO) 300 MG injection Inject into the vein.   Yes [provider]  BD POSIFLUSH 0.9 % SOLN injection Inject  10 mLs into the vein as needed. 04/12/23   [provider]  nortriptyline (PAMELOR) 10 MG capsule  03/29/22   [provider]  nortriptyline (PAMELOR) 10 MG capsule Take 20 mg by mouth at bedtime. 12/23/22   [provider]  QUEtiapine (SEROQUEL) 25 MG tablet Take 25 mg by mouth at bedtime. Seroquel 25 mg nightly for one week, then increase to 50 mg nightly for mood. 08/23/22   [provider]  zonisamide (ZONEGRAN) 25 MG capsule Take 25 mg by mouth daily. Patient not taking: Reported on 07/12/2023    [provider]    Family History Family History  Problem Relation Age of Onset   Bipolar disorder Mother        dx at 55    ADD / ADHD Father        undx   ADD / ADHD Brother    Depression Maternal Grandmother    Anxiety disorder Maternal Grandmother    Bipolar disorder Maternal Grandmother     Social History Social History   Tobacco Use   Smoking status: Never   Smokeless tobacco: Never  Vaping Use   Vaping status: Never Used  Substance Use Topics   Alcohol use: No   Drug use: No     Allergies   Elemental sulfur, Ibuprofen, and Omnicef [cefdinir]   Review of Systems Review of Systems   Physical Exam Triage Vital Signs ED Triage Vitals  Encounter Vitals Group     BP 10/01/23 1500 (!) 152/87     Systolic BP Percentile --      Diastolic BP Percentile --      Pulse Rate 10/01/23 1500 67     Resp 10/01/23 1500 16     Temp 10/01/23 1500 98.7 F (37.1 C)     Temp Source 10/01/23 1500 Oral     SpO2 10/01/23 1500 98 %     Weight --      Height --      Head Circumference --      Peak Flow --      Pain Score 10/01/23 1505 6     Pain Loc --      Pain Education --      Exclude from Growth Chart --    No data found.  Updated Vital Signs BP (!) 152/87   Pulse 67   Temp 98.7 F (37.1 C) (Oral)   Resp 16   SpO2 98%   Visual Acuity Right Eye Distance:   Left Eye Distance:   Bilateral Distance:    Right Eye Near:   Left Eye Near:    Bilateral Near:     Physical Exam Constitutional:      Appearance: Normal appearance.  Eyes:     Extraocular Movements: Extraocular movements intact.  Pulmonary:     Effort: Pulmonary effort is normal.  Musculoskeletal:     Comments: Tenderness to the radial aspect extending into the base of the right thumb, but moderate to severe swelling along the radial aspect of the wrist and over the scaphoid bone, no ecchymosis or deformity noted, limited range of motion to the right thumb unable to flex, limited range of motion to the right index finger, difficulty flexing, sensation intact, capillary refill less than 3, 2+ radial pulse  Moderate swelling  without ecchymosis or deformity along the radial aspect of the right forearm  Neurological:     Mental Status: Shabria is alert and oriented to person, place, and time. Mental  status is at baseline.      UC Treatments / Results  Labs (all labs ordered are listed, but only abnormal results are displayed) Labs Reviewed - No data to display  EKG   Radiology DG Hand Complete Right  Result Date: 10/01/2023 CLINICAL DATA:  Fall with decreased range of motion. EXAM: RIGHT HAND - COMPLETE 3+ VIEW COMPARISON:  None Available. FINDINGS: There is no evidence of fracture or dislocation. There is no evidence of arthropathy or other focal bone abnormality. Soft tissues are unremarkable. IMPRESSION: Negative. Electronically Signed   By: Signa Kell M.D.   On: 10/01/2023 16:53   DG Wrist Complete Right  Result Date: 10/01/2023 CLINICAL DATA:  Status post fall with decreased range of motion. EXAM: RIGHT WRIST - COMPLETE 3+ VIEW COMPARISON:  None Available. FINDINGS: There is no evidence of fracture or dislocation. There is no evidence of arthropathy or other focal bone abnormality. Soft tissues are unremarkable. IMPRESSION: Negative. Electronically Signed   By: Signa Kell M.D.   On: 10/01/2023 16:52    Procedures Procedures (including critical care time)  Medications Ordered in UC Medications - No data to display  Initial Impression / Assessment and Plan / UC Course  I have reviewed the triage vital signs and the nursing notes.  Pertinent labs & imaging results that were available during my care of the patient were reviewed by me and considered in my medical decision making (see chart for details).  Acute pain of right wrist, injury due to fall  X-ray pending, placed wrist brace for stability, prescribed prednisone and Tylenol 3 for management of pain, PDMP reviewed, low risk, based on x-ray results was given instructions that if fracture noted she is to wear brace at all times with  exception of showering until evaluated by orthopedic specialist with follow-up in 1 to 2 weeks, if no fracture is noted she is to wear brace as needed until pain resolves with follow-up with orthopedics if no improvement seen 2 weeks post injury Final Clinical Impressions(s) / UC Diagnoses   Final diagnoses:  Injury due to fall, initial encounter  Acute pain of right wrist     Discharge Instructions      X-rays pending, you will be notified of results tomorrow morning via telephone  You have been placed in a wrist brace that stability and support, wear at all times until you were given x-ray result   Begin prednisone every morning with food for 5 days to help reduce swelling and pain, may take Tylenol in addition to this  May take Tylenol 3 which has codeine in it for severe pain, please be mindful this will make you feel sleepy  If not broken then you may wear as needed until pain resolves  If there is a break in the bone you will need to wear at all times until evaluated by orthopedic specialist   ED Prescriptions     Medication Sig Dispense Auth. Provider   predniSONE (DELTASONE) 20 MG tablet Take 2 tablets (40 mg total) by mouth daily. 10 tablet Kele Barthelemy R, NP   acetaminophen-codeine (TYLENOL #3) 300-30 MG tablet Take 1-2 tablets by mouth every 6 (six) hours as needed for moderate pain (pain score 4-6). 15 tablet Mayan Kloepfer, Elita Boone, NP      I have reviewed the PDMP during this encounter.   Valinda Hoar, [place] 10/02/23 [phone]

## 2023-10-02 NOTE — Telephone Encounter (Signed)
X-ray results reported to father via telephone, 2 patient identifiers used, discussed with patient yesterday that it was okay to report results to family member listed on file, advised to continue treatment plan as discussed

## 2023-10-12 ENCOUNTER — Other Ambulatory Visit: Payer: Self-pay | Admitting: Gastroenterology

## 2023-10-13 ENCOUNTER — Telehealth: Payer: Self-pay

## 2023-10-13 DIAGNOSIS — K5100 Ulcerative (chronic) pancolitis without complications: Secondary | ICD-10-CM

## 2023-10-13 NOTE — Telephone Encounter (Signed)
On the report it said that the blood that was drawn for some reason they could run the LFTs with the blood they drew if you want LFT she will have to come back for lab work

## 2023-10-13 NOTE — Telephone Encounter (Signed)
Called and left a message for call back  

## 2023-10-13 NOTE — Telephone Encounter (Signed)
-----   Message from [facility] sent at 10/12/2023 10:31 PM EST ----- Dominique [name]  I see only CBC results.  Not sure if the LFTs were checked are not.  Please look into it  RV

## 2023-10-13 NOTE — Telephone Encounter (Signed)
Sent mychart message
